# Patient Record
Sex: Female | Born: 1945 | Race: White | Hispanic: No | Marital: Married | Smoking: Never smoker
Health system: Southern US, Community
[De-identification: ages and names within clinical notes are randomized; demographics above are authoritative.]

## PROBLEM LIST (undated history)

## (undated) DIAGNOSIS — I639 Cerebral infarction, unspecified: Secondary | ICD-10-CM

## (undated) DIAGNOSIS — D649 Anemia, unspecified: Secondary | ICD-10-CM

## (undated) DIAGNOSIS — K649 Unspecified hemorrhoids: Secondary | ICD-10-CM

## (undated) DIAGNOSIS — S060X9A Concussion with loss of consciousness of unspecified duration, initial encounter: Secondary | ICD-10-CM

## (undated) DIAGNOSIS — C539 Malignant neoplasm of cervix uteri, unspecified: Secondary | ICD-10-CM

## (undated) DIAGNOSIS — D759 Disease of blood and blood-forming organs, unspecified: Secondary | ICD-10-CM

## (undated) DIAGNOSIS — Z8719 Personal history of other diseases of the digestive system: Secondary | ICD-10-CM

## (undated) DIAGNOSIS — R42 Dizziness and giddiness: Secondary | ICD-10-CM

## (undated) DIAGNOSIS — R51 Headache: Secondary | ICD-10-CM

## (undated) DIAGNOSIS — I82409 Acute embolism and thrombosis of unspecified deep veins of unspecified lower extremity: Secondary | ICD-10-CM

## (undated) DIAGNOSIS — G459 Transient cerebral ischemic attack, unspecified: Secondary | ICD-10-CM

## (undated) DIAGNOSIS — F32A Depression, unspecified: Secondary | ICD-10-CM

## (undated) DIAGNOSIS — K5790 Diverticulosis of intestine, part unspecified, without perforation or abscess without bleeding: Secondary | ICD-10-CM

## (undated) DIAGNOSIS — I10 Essential (primary) hypertension: Secondary | ICD-10-CM

## (undated) DIAGNOSIS — K219 Gastro-esophageal reflux disease without esophagitis: Secondary | ICD-10-CM

## (undated) DIAGNOSIS — F329 Major depressive disorder, single episode, unspecified: Secondary | ICD-10-CM

## (undated) HISTORY — PX: OOPHORECTOMY: SHX86

## (undated) HISTORY — PX: OTHER SURGICAL HISTORY: SHX169

## (undated) HISTORY — DX: Dizziness and giddiness: R42

## (undated) HISTORY — PX: CERVICAL DISC ARTHROPLASTY: SHX587

## (undated) SURGERY — EGD (ESOPHAGOGASTRODUODENOSCOPY)
Anesthesia: Moderate Sedation

---

## 1974-07-17 DIAGNOSIS — C539 Malignant neoplasm of cervix uteri, unspecified: Secondary | ICD-10-CM

## 1974-07-17 HISTORY — DX: Malignant neoplasm of cervix uteri, unspecified: C53.9

## 1992-07-17 HISTORY — PX: OTHER SURGICAL HISTORY: SHX169

## 1992-07-17 HISTORY — PX: ABDOMINAL HYSTERECTOMY: SHX81

## 1999-03-20 ENCOUNTER — Ambulatory Visit (HOSPITAL_COMMUNITY): Admission: RE | Admit: 1999-03-20 | Discharge: 1999-03-20 | Payer: Self-pay | Admitting: Neurosurgery

## 1999-03-20 ENCOUNTER — Encounter: Payer: Self-pay | Admitting: Neurosurgery

## 1999-10-12 ENCOUNTER — Other Ambulatory Visit: Admission: RE | Admit: 1999-10-12 | Discharge: 1999-10-12 | Payer: Self-pay | Admitting: Obstetrics and Gynecology

## 2000-10-15 ENCOUNTER — Other Ambulatory Visit: Admission: RE | Admit: 2000-10-15 | Discharge: 2000-10-15 | Payer: Self-pay | Admitting: Obstetrics and Gynecology

## 2001-11-05 ENCOUNTER — Other Ambulatory Visit: Admission: RE | Admit: 2001-11-05 | Discharge: 2001-11-05 | Payer: Self-pay | Admitting: Obstetrics and Gynecology

## 2002-02-28 ENCOUNTER — Ambulatory Visit (HOSPITAL_COMMUNITY): Admission: RE | Admit: 2002-02-28 | Discharge: 2002-02-28 | Payer: Self-pay | Admitting: Neurology

## 2002-02-28 ENCOUNTER — Encounter: Payer: Self-pay | Admitting: Neurology

## 2002-03-19 ENCOUNTER — Encounter (INDEPENDENT_AMBULATORY_CARE_PROVIDER_SITE_OTHER): Payer: Self-pay | Admitting: Specialist

## 2002-03-19 ENCOUNTER — Ambulatory Visit (HOSPITAL_COMMUNITY): Admission: RE | Admit: 2002-03-19 | Discharge: 2002-03-19 | Payer: Self-pay | Admitting: Gastroenterology

## 2002-03-25 ENCOUNTER — Ambulatory Visit (HOSPITAL_BASED_OUTPATIENT_CLINIC_OR_DEPARTMENT_OTHER): Admission: RE | Admit: 2002-03-25 | Discharge: 2002-03-25 | Payer: Self-pay | Admitting: Orthopedic Surgery

## 2002-07-01 ENCOUNTER — Ambulatory Visit (HOSPITAL_COMMUNITY): Admission: RE | Admit: 2002-07-01 | Discharge: 2002-07-01 | Payer: Self-pay | Admitting: Gastroenterology

## 2002-07-12 ENCOUNTER — Encounter: Payer: Self-pay | Admitting: Internal Medicine

## 2002-07-12 ENCOUNTER — Ambulatory Visit (HOSPITAL_COMMUNITY): Admission: RE | Admit: 2002-07-12 | Discharge: 2002-07-12 | Payer: Self-pay | Admitting: Internal Medicine

## 2002-11-07 ENCOUNTER — Other Ambulatory Visit: Admission: RE | Admit: 2002-11-07 | Discharge: 2002-11-07 | Payer: Self-pay | Admitting: Obstetrics and Gynecology

## 2002-12-03 ENCOUNTER — Encounter: Payer: Self-pay | Admitting: Gastroenterology

## 2002-12-03 ENCOUNTER — Encounter: Admission: RE | Admit: 2002-12-03 | Discharge: 2002-12-03 | Payer: Self-pay | Admitting: Gastroenterology

## 2003-06-18 ENCOUNTER — Ambulatory Visit (HOSPITAL_BASED_OUTPATIENT_CLINIC_OR_DEPARTMENT_OTHER): Admission: RE | Admit: 2003-06-18 | Discharge: 2003-06-18 | Payer: Self-pay | Admitting: Orthopedic Surgery

## 2003-06-18 ENCOUNTER — Ambulatory Visit (HOSPITAL_COMMUNITY): Admission: RE | Admit: 2003-06-18 | Discharge: 2003-06-18 | Payer: Self-pay | Admitting: Orthopedic Surgery

## 2003-08-18 ENCOUNTER — Emergency Department (HOSPITAL_COMMUNITY): Admission: EM | Admit: 2003-08-18 | Discharge: 2003-08-18 | Payer: Self-pay

## 2004-03-28 ENCOUNTER — Ambulatory Visit (HOSPITAL_COMMUNITY): Admission: RE | Admit: 2004-03-28 | Discharge: 2004-03-28 | Payer: Self-pay | Admitting: Internal Medicine

## 2004-05-31 ENCOUNTER — Encounter: Admission: RE | Admit: 2004-05-31 | Discharge: 2004-05-31 | Payer: Self-pay | Admitting: Orthopaedic Surgery

## 2004-07-19 ENCOUNTER — Other Ambulatory Visit: Admission: RE | Admit: 2004-07-19 | Discharge: 2004-07-19 | Payer: Self-pay | Admitting: Obstetrics and Gynecology

## 2004-08-10 ENCOUNTER — Encounter (INDEPENDENT_AMBULATORY_CARE_PROVIDER_SITE_OTHER): Payer: Self-pay | Admitting: Specialist

## 2004-08-10 ENCOUNTER — Ambulatory Visit (HOSPITAL_COMMUNITY): Admission: RE | Admit: 2004-08-10 | Discharge: 2004-08-10 | Payer: Self-pay | Admitting: Gastroenterology

## 2004-08-19 ENCOUNTER — Encounter: Admission: RE | Admit: 2004-08-19 | Discharge: 2004-08-19 | Payer: Self-pay | Admitting: Gastroenterology

## 2004-08-22 ENCOUNTER — Encounter (INDEPENDENT_AMBULATORY_CARE_PROVIDER_SITE_OTHER): Payer: Self-pay | Admitting: *Deleted

## 2004-08-22 ENCOUNTER — Ambulatory Visit (HOSPITAL_COMMUNITY): Admission: RE | Admit: 2004-08-22 | Discharge: 2004-08-22 | Payer: Self-pay | Admitting: Gastroenterology

## 2005-02-20 ENCOUNTER — Encounter: Admission: RE | Admit: 2005-02-20 | Discharge: 2005-02-20 | Payer: Self-pay | Admitting: Gastroenterology

## 2005-04-18 ENCOUNTER — Ambulatory Visit (HOSPITAL_COMMUNITY): Admission: RE | Admit: 2005-04-18 | Discharge: 2005-04-18 | Payer: Self-pay | Admitting: Internal Medicine

## 2005-04-24 ENCOUNTER — Ambulatory Visit (HOSPITAL_COMMUNITY): Admission: RE | Admit: 2005-04-24 | Discharge: 2005-04-24 | Payer: Self-pay | Admitting: Internal Medicine

## 2005-12-21 ENCOUNTER — Encounter: Admission: RE | Admit: 2005-12-21 | Discharge: 2005-12-21 | Payer: Self-pay | Admitting: Internal Medicine

## 2006-04-06 ENCOUNTER — Encounter: Admission: RE | Admit: 2006-04-06 | Discharge: 2006-04-06 | Payer: Self-pay | Admitting: Neurology

## 2006-06-28 ENCOUNTER — Ambulatory Visit (HOSPITAL_COMMUNITY): Admission: RE | Admit: 2006-06-28 | Discharge: 2006-06-28 | Payer: Self-pay | Admitting: Internal Medicine

## 2006-08-27 ENCOUNTER — Ambulatory Visit: Payer: Self-pay | Admitting: Cardiology

## 2006-09-19 ENCOUNTER — Ambulatory Visit: Payer: Self-pay

## 2006-09-19 ENCOUNTER — Ambulatory Visit: Payer: Self-pay | Admitting: Cardiology

## 2006-09-19 ENCOUNTER — Encounter: Payer: Self-pay | Admitting: Cardiology

## 2007-04-09 ENCOUNTER — Ambulatory Visit (HOSPITAL_COMMUNITY)
Admission: RE | Admit: 2007-04-09 | Discharge: 2007-04-09 | Payer: Self-pay | Admitting: Physical Medicine and Rehabilitation

## 2008-02-05 ENCOUNTER — Ambulatory Visit (HOSPITAL_COMMUNITY): Admission: RE | Admit: 2008-02-05 | Discharge: 2008-02-05 | Payer: Self-pay | Admitting: Internal Medicine

## 2008-07-02 ENCOUNTER — Ambulatory Visit (HOSPITAL_COMMUNITY): Admission: RE | Admit: 2008-07-02 | Discharge: 2008-07-05 | Payer: Self-pay | Admitting: Neurosurgery

## 2008-07-17 HISTORY — PX: SPINAL CORD STIMULATOR IMPLANT: SHX2422

## 2009-12-13 ENCOUNTER — Ambulatory Visit: Payer: Self-pay | Admitting: Cardiology

## 2009-12-13 ENCOUNTER — Inpatient Hospital Stay (HOSPITAL_COMMUNITY): Admission: EM | Admit: 2009-12-13 | Discharge: 2009-12-15 | Payer: Self-pay | Admitting: Emergency Medicine

## 2010-08-07 ENCOUNTER — Encounter: Payer: Self-pay | Admitting: Internal Medicine

## 2010-09-09 ENCOUNTER — Encounter (INDEPENDENT_AMBULATORY_CARE_PROVIDER_SITE_OTHER): Payer: Self-pay | Admitting: *Deleted

## 2010-09-09 ENCOUNTER — Encounter: Payer: Self-pay | Admitting: Internal Medicine

## 2010-09-13 NOTE — Letter (Signed)
Summary: New Patient letter  [facility]  [address]   [place], [place] [address]   Phone: [phone]  Fax: [phone]       09/09/2010 MRN: [id number]  [name] FOGLESONG [address] [place], [place]  [address]  Dear Ms. Batch,  Welcome to the Gastroenterology Division at [facility].    You are scheduled to see Dr.  Juanda Chance on 10/17/2010 at 9:15 on the 3rd floor at [facility], [address]  We ask that you try to arrive at our office 15 minutes prior to your appointment time to allow for check-in.  We would like you to complete the enclosed self-administered evaluation form prior to your visit and bring it with you on the day of your appointment.  We will review it with you.  Also, please bring a complete list of all your medications or, if you prefer, bring the medication bottles and we will list them.  Please bring your insurance card so that we may make a copy of it.  If your insurance requires a referral to see a specialist, please bring your referral form from your primary care physician.  Co-payments are due at the time of your visit and may be paid by cash, check or credit card.     Your office visit will consist of a consult with your physician (includes a physical exam), any laboratory testing he/she may order, scheduling of any necessary diagnostic testing (e.g. x-ray, ultrasound, CT-scan), and scheduling of a procedure (e.g. Endoscopy, Colonoscopy) if required.  Please allow enough time on your schedule to allow for any/all of these possibilities.    If you cannot keep your appointment, please call [phone] to cancel or reschedule prior to your appointment date.  This allows [place] the opportunity to schedule an appointment for another patient in need of care.  If you do not cancel or reschedule by 5 p.m. the business day prior to your appointment date, you will be charged a $50.00 late cancellation/no-show fee.    Thank you for choosing [facility] for your medical needs.  We appreciate the opportunity to care for you.  Please visit [place] at our website  to learn more about our practice.                     Sincerely,                                                             The Gastroenterology Division

## 2010-09-13 NOTE — Miscellaneous (Signed)
Summary: DR Loreta Ave PT  We recieved a call from Dr Jeri Lager requesting office visit with Dr Juanda Chance for patient. They told our office that patient was seen by Dr Loreta Ave last in 2006. Per Dr Kenna Gilbert office, patient was last seen with them on May 24, 2010. [name] Brooks, [facility] will call back to cancel appointment since office policy states that if patient has been seen within 1 year by another GI physician, patient must have all previous GI records reviewed by our doctor before accepting as a patient. Dottie Nelson-Smith CMA Duncan Dull)  September 09, 2010 1:48 PM

## 2010-10-03 LAB — DIFFERENTIAL
Lymphocytes Relative: 25 % (ref 12–46)
Lymphs Abs: 3 10*3/uL (ref 0.7–4.0)
Monocytes Absolute: 0.8 10*3/uL (ref 0.1–1.0)
Monocytes Relative: 7 % (ref 3–12)
Neutro Abs: 8.3 10*3/uL — ABNORMAL HIGH (ref 1.7–7.7)

## 2010-10-03 LAB — CBC
HCT: 40.2 % (ref 36.0–46.0)
HCT: 40.8 % (ref 36.0–46.0)
Hemoglobin: 11.3 g/dL — ABNORMAL LOW (ref 12.0–15.0)
Hemoglobin: 13.3 g/dL (ref 12.0–15.0)
MCHC: 32.6 g/dL (ref 30.0–36.0)
MCV: 82.5 fL (ref 78.0–100.0)
Platelets: 282 10*3/uL (ref 150–400)
RDW: 14.2 % (ref 11.5–15.5)
RDW: 14.3 % (ref 11.5–15.5)
RDW: 14.4 % (ref 11.5–15.5)

## 2010-10-03 LAB — URINALYSIS, ROUTINE W REFLEX MICROSCOPIC
Bilirubin Urine: NEGATIVE
Ketones, ur: NEGATIVE mg/dL
Nitrite: NEGATIVE
Protein, ur: NEGATIVE mg/dL
pH: 6 (ref 5.0–8.0)

## 2010-10-03 LAB — BASIC METABOLIC PANEL
BUN: 3 mg/dL — ABNORMAL LOW (ref 6–23)
BUN: 7 mg/dL (ref 6–23)
CO2: 27 mEq/L (ref 19–32)
Calcium: 8.8 mg/dL (ref 8.4–10.5)
Creatinine, Ser: 0.91 mg/dL (ref 0.4–1.2)
GFR calc non Af Amer: >60 mL/min (ref >60)
GFR calc non Af Amer: 56 mL/min — ABNORMAL LOW (ref >60)
GFR calc non Af Amer: >60 mL/min (ref >60)
Glucose, Bld: 97 mg/dL (ref 70–99)
Glucose, Bld: 105 mg/dL — ABNORMAL HIGH (ref 70–99)
Glucose, Bld: 88 mg/dL (ref 70–99)
Potassium: 3.3 mEq/L — ABNORMAL LOW (ref 3.5–5.1)
Potassium: 3.4 mEq/L — ABNORMAL LOW (ref 3.5–5.1)
Sodium: 141 mEq/L (ref 135–145)
Sodium: 142 mEq/L (ref 135–145)

## 2010-10-03 LAB — URINE CULTURE
Colony Count: NO GROWTH
Culture: NO GROWTH

## 2010-10-03 LAB — URINE MICROSCOPIC-ADD ON

## 2010-10-03 LAB — COMPREHENSIVE METABOLIC PANEL
Albumin: 3.8 g/dL (ref 3.5–5.2)
BUN: 13 mg/dL (ref 6–23)
Creatinine, Ser: 1.01 mg/dL (ref 0.4–1.2)
Total Protein: 7.6 g/dL (ref 6.0–8.3)

## 2010-10-03 LAB — HEMOCCULT GUIAC POC 1CARD (OFFICE): Fecal Occult Bld: NEGATIVE

## 2010-10-03 LAB — CARDIAC PANEL(CRET KIN+CKTOT+MB+TROPI)
CK, MB: 2.5 ng/mL (ref 0.3–4.0)
CK, MB: 3 ng/mL (ref 0.3–4.0)
Relative Index: 2.3 (ref 0.0–2.5)
Total CK: 130 U/L (ref 7–177)
Total CK: 133 U/L (ref 7–177)
Troponin I: 0.01 ng/mL (ref 0.00–0.06)

## 2010-10-17 ENCOUNTER — Ambulatory Visit: Payer: Self-pay | Admitting: Internal Medicine

## 2010-11-29 NOTE — Op Note (Signed)
NAMEWINDI, TORO NO.:  [id number]   MEDICAL RECORD NO.:  [id number]          PATIENT TYPE:  OIB   LOCATION:  3526                         FACILITY:  [facility]   PHYSICIAN:  Danae Orleans. Venetia Maxon, M.D.  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   PROCEDURE:  Thoracic laminectomy and placement of spinal cord  stimulator, tripolar lead with implantable pulse generator in  subcutaneous pocket.   SURGEON:  Danae Orleans. Venetia Maxon, MD   ASSISTANT:  Georgiann Cocker, RN   ANESTHESIA:  General endotracheal anesthesia.   ESTIMATED BLOOD LOSS:  Minimal.   COMPLICATIONS:  None.   DISPOSITION:  Recovery.   INDICATIONS:  Legacy Carrender is a 65 year old woman with chronic intractable  bilateral lower extremity and low back pain.  She had done well with a  trial of spinal cord stimulation and was elected to place a spinal cord  stimulator.   PROCEDURE IN DETAIL:  Ms. Detwiler was brought to the operating room.  Following a satisfactory and an uncomplicated induction of general  endotracheal anesthesia plus placement of intravenous lines, the patient  was placed in a prone position on chest rolls.  Her back and upper  buttocks were then prepped and draped in the usual sterile fashion with  Betadine scrub and paint.  C-arm fluoroscopy was used to confirm the  level.  An incision was made in the midline after infiltrating the skin  and subcutaneous tissues with local lidocaine at the T10-T11 level,  carried through adipose tissue to the thoracolumbar fascia and was  incised sharply on the left side of midline.  After confirmation of  level, a hemilaminotomy of T10 was performed and spinal cord dura was  exposed at this time with a  high-speed drill and variety Kerrison  rongeurs.  Hemostasis was assured with bone wax.  After trial dummy  paddle placement, the tripolar lead was selected and it was positioned  with the superior aspect of the bottom of T7 covering all of T8 and T9  to the midline and extending to the right of midline as the patient has  more right than left-sided pain.  The fascia was then closed, Silastic  anchors were anchored to the supraspinous ligament, and a loop of  electrode was then placed in the subcutaneous pocket.  A subcutaneous  tunnel was then created after incising on the left side between the  iliac crest and ribs.  A subcutaneous pocket was created.  The  retractable battery was then placed and connections were locked down  appropriately and torqued appropriately.  The redundant electrode was  placed circularized behind the battery and the assembly was placed  within the subcutaneous pocket.  Wounds were  irrigated and closed with 2-0 and 3-0 Vicryl sutures and wounds were  dressed with Dermabond.  Impedances were checked and found  to be  correct.  The patient was then extubated in the operating room and taken  to recovery in stable and satisfactory condition having tolerated the  operation well.  All counts were correct at the end of the case.      Danae Orleans. Venetia Maxon, M.D.  Electronically Signed     JDS/MEDQ  D:  07/02/2008  T:  07/02/2008  Job:  [id number]

## 2010-12-02 NOTE — Assessment & Plan Note (Signed)
[facility]                            CARDIOLOGY OFFICE NOTE   NAME:Booz, TAMBERLY POMPLUN                         MRN:          [id number]  DATE:08/27/2006                            DOB:          [birth year]    I was asked by Dr. Sudie Bailey to do a preoperative clearance for shoulder  arthroscopy on Ms. Marquesha Gassner.   She carries a diagnosis of nonspecific tachycardia.   HISTORY OF PRESENT ILLNESS:  Ms. Blough is a 65 year old, married white  female who comes with her husband today with a history of nonspecific  tachycardia.   She occasionally feels her heart beating fast when she is getting  dressed for church and doing other activities. She sometimes gets a  little hot and sweaty but denies any chest pressure, pain or radiation  to her arm.   She is really not having a lot of tachypalpitations per say.   Her electrocardiogram in the office have shown a sinus tachycardia or  sinus rhythm. I do not see any other arrhythmias. Dr. Sudie Bailey  recommended a Holter monitor but she would prefer [place] do it up here.   PAST MEDICAL HISTORY:  She has unspecified depression and today has  multiple complaints. She has a chronic tremor followed by Dr. Sandria Manly. She  has also got a lower back problem that Dr. Venetia Maxon is following. She has  essential hypertension but recently because of orthostatic changes is  off all antihypertensives. She has chronic vertigo that Dr. Sandria Manly is  following as well.   Apparently she had a blood clot in her right leg in the past.   She also has hyperlipidemia well controlled on Vytorin and  hypothyroidism with recent normal thyroid studies August 14, 2006.   She is intolerant of prednisone. She has no dye allergy.   CURRENT MEDICATIONS:  1. Plavix 75 mg a day.  2. Topamax 25 mg a day.  3. Vytorin 10/40.  4. Cymbalta 30 mg a day.  5. Levothyroxine 25 mg a day.   PAST SURGICAL HISTORY:  Neck surgery in 2003.   FAMILY HISTORY:  Father and  mother both died of heart attacks. Mother  was 61, father 33. There is no premature history of coronary disease in  her family.   SOCIAL HISTORY:  She sits at home and looks at the walls all day. She  is very depressed. She is married and has 2 children.   REVIEW OF SYSTEMS:  She has had chronic fatigue, apparently has had an  ulcer in the past, hiatal hernia, history of gastroesophageal reflux,  chronic arthritis, anxiety and depression. The rest of her review of  systems other than the HPI are negative.   PHYSICAL EXAMINATION:  GENERAL:  She has a very flat affect and very  short answers.  VITAL SIGNS:  Her blood pressure is 120/78, her pulse is 89 and regular.  She is in normal sinus rhythm with a normal PR, QRS and QT interval. Her  weight is 170, she is 5 feet 3.  HEENT:  Normocephalic, atraumatic. PERRLA. Extraocular movements intact.  Sclera clear. Facial symmetry is normal. Dentition is satisfactory.  NECK:  Carotid upstrokes are equal bilaterally without bruits. There is  no JVD. Thyroid is not enlarged, trachea is midline.  LUNGS:  Clear to auscultation and percussion.  HEART:  Reveals a normal S1, S2 without murmur or gallop.  ABDOMEN:  Soft with good bowel sounds, no midline bruit. There is no  hepatomegalia.  EXTREMITIES:  Reveal no edema. Pulses are intact.  NEUROLOGIC:  Grossly intact.  SKIN:  Intact.   EKG again shows sinus rhythm with some low voltage and normal PR, QRS,  and QTc interval.   ASSESSMENT:  1. Sensations of tachycardia with a diagnosis of a nonspecified      tachycardia. Her thyroid replacement is euthyroid and she is on no      drugs that I think would exacerbate her heart rate.  2. Before we clear her for surgery, we need to make sure that her left      ventricular function is normal and she has no other structural      heart disease. We also will check a 40-hour Holter to rule out any      associated arrhythmias.     Thomas C. Daleen Squibb, MD,  [facility]  Electronically Signed    TCW/MedQ  DD: 08/27/2006  DT: 08/27/2006  Job #: [id number]   cc:   Philemon Kingdom

## 2010-12-02 NOTE — Op Note (Signed)
   NAME:  [name] Brooks, [name] Brooks                      ACCOUNT NO.:  [id number]   MEDICAL RECORD NO.:  [id number]                   PATIENT TYPE:  AMB   LOCATION:  ENDO                                 FACILITY:  [facility]   PHYSICIAN:  Anselmo Rod, M.D.               DATE OF BIRTH:  04-27-1946   DATE OF PROCEDURE:  07/01/2002  DATE OF DISCHARGE:                                 OPERATIVE REPORT   PROCEDURE PERFORMED:  Colonoscopy.   ENDOSCOPIST:  Charna Elizabeth, M.D.   INSTRUMENT USED:  Olympus adjustable pediatric video colonoscope.   INDICATIONS FOR PROCEDURE:  Personal history of colonic polyps in a 65-year-  old white female.  Rule out recurrent polyps.   PREPROCEDURE PREPARATION:  Informed consent was procured from the patient.  The patient was fasted for eight hours prior to the procedure and prepped  with a bottle of  MiraLax the night prior to the procedure.  The patient was  also advised to discontinue her Plavix four days prior to the procedure.  This was verified with Dr. Imagene Gurney office.   DESCRIPTION OF PROCEDURE:  The patient was placed in left lateral decubitus  position and sedated with 100 mg of Demerol and 10 mg of Versed  intravenously.  Once the patient was adequately sedated and maintained on  low flow oxygen and continuous cardiac monitoring, the Olympus video  colonoscope was advanced from the rectum to the cecum without difficulty.  There was a large amount of residual stool in the colon.  No masses, polyps,  erosions, ulcerations or diverticula were seen.  The terminal ileum appeared  normal.  Small lesions could have been missed.   IMPRESSION:  1. Large amount of residual stool in the colon.  No masses or polyps seen.  2. Small lesions could have been missed.  3. Multiple washes done to adequate visualize the colonic mucosa.  Patient's     position changed from left lateral to supine position.  Procedure     complete up to the cecum.   RECOMMENDATIONS:  1.  Repeat colorectal cancer screening is recommended in the next five years     unless the patient develops any     abnormal symptoms in the interim.  2. Resume Plavix.  3. Outpatient follow-up on a p.r.n. basis.                                                   Anselmo Rod, M.D.    JNM/MEDQ  D:  07/01/2002  T:  07/01/2002  Job:  [id number]   cc:   Marcelino Duster L. Vincente Poli, M.D.  [address], [address] [place]  [place] [address]  Fax: [phone]

## 2010-12-02 NOTE — Op Note (Signed)
NAME:  [name] Brooks, [name] Brooks                      ACCOUNT NO.:  [id number]   MEDICAL RECORD NO.:  [id number]                   PATIENT TYPE:  AMB   LOCATION:  ENDO                                 FACILITY:  [facility]   PHYSICIAN:  Charna Elizabeth, M.D.                   DATE OF BIRTH:  December 18, 1945   DATE OF PROCEDURE:  03/19/2002  DATE OF DISCHARGE:                                 OPERATIVE REPORT   PROCEDURE PERFORMED:  Esophagogastroduodenoscopy with biopsy.   ENDOSCOPIST:  Charna Elizabeth, M.D.   INSTRUMENT USED:  Olympus video panendoscope.   INDICATIONS FOR PROCEDURE:  The patient is a 65 year old white female with  dysphagia, retrosternal discomfort, coughing and regurgitation.  Rule out  esophagitis, ulcer disease, etc.  The patient has a history of severe  reflux.   PREPROCEDURE PREPARATION:  Informed consent was procured from the patient.  The patient was fasted for eight hours prior to the procedure.   PREPROCEDURE PHYSICAL:  The patient had stable vital signs.  Neck supple,  chest clear to auscultation.  S1, S2 regular.  Abdomen soft with normal  bowel sounds.   DESCRIPTION OF PROCEDURE:  The patient was placed in the left lateral  decubitus position and sedated with 60 mg of Demerol and 6 mg of Versed  intravenously.  Once the patient was adequately sedated and maintained on  low-flow oxygen and continuous cardiac monitoring, the Olympus video  panendoscope was advanced through the mouth piece over the tongue into the  esophagus under direct vision.  The entire esophagus appeared widely patent  with no evidence of ring, stricture, masses, esophagus or Barrett's mucosa.  The scope was then advanced to the stomach.  There was a patchy area of  hemorrhagic gastritis in the antrum.  Biopsies were done to rule out  presence of Helicobacter pylori.  The rest of the gastric mucosa appeared  normal.  A small  hiatal hernia was seen on retroflexion.  The proximal  small bowel including  the duodenal bulb appeared normal as well up to 60 cm.   IMPRESSION:  1. Antral gastritis.  2. Small  hiatal hernia.  3. Normal-appearing esophagus and proximal small bowel.   RECOMMENDATIONS:  1. Continue present medication including proton pump inhibitor.  2.     Treat with antibiotics if Helicobacter pylori present.  3. Avoid all nonsteroidals including aspirin.  4. Outpatient follow-up in two weeks.                                               Charna Elizabeth, M.D.    JM/MEDQ  D:  03/20/2002  T:  03/20/2002  Job:  [id number]   cc:   Veverly Fells. Arletha Grippe, M.D.   Michelle L. Vincente Poli, M.D.  [address], [address]  [place]  [place] [address]  Fax: [phone]

## 2010-12-02 NOTE — Op Note (Signed)
[name], [name] Brooks                  ACCOUNT NO.:  [id number]   MEDICAL RECORD NO.:  [id number]          PATIENT TYPE:  AMB   LOCATION:  ENDO                         FACILITY:  [facility]   PHYSICIAN:  Anselmo Rod, M.D.  DATE OF BIRTH:  [birth year]   DATE OF PROCEDURE:  08/22/2004  DATE OF DISCHARGE:                                 OPERATIVE REPORT   PROCEDURE:  Colonoscopy with random biopsies.   ENDOSCOPIST:  Anselmo Rod, M.D.   INSTRUMENT USED:  Olympus video colonoscope.   INDICATIONS FOR PROCEDURE:  A 66 year old white female underwent a screening  colonoscopy.  The patient has a history of episodic diarrhea with severe  abdominal pain, rule out collagenous colitis, masses, polyps, etc.   PRE-PROCEDURE PREPARATION:  An informed consent was procured from the  patient.  The patient was fasted for eight hours prior to the procedure, and  prepped with a bottle of magnesium citrate and one gallon of GoLYTELY on the  night prior to the procedure.  The risks and benefits of the procedure,  including a 10% missed rate of cancer and polyps, was discussed with the  patient as well.   PRE-PROCEDURE PHYSICAL EXAMINATION:  VITAL SIGNS:  Stable vital signs.  NECK:  Supple.  CHEST:  Clear to auscultation.  HEART:  S1, S2 regular.  ABDOMEN:  Soft, with normal bowel sounds.   DESCRIPTION OF PROCEDURE:  The patient was placed in the left lateral  decubitus position and sedated with Demerol and Versed for the EGD.  No  additional sedation was used for the colonoscopy.  Once the patient was  adequately sedated and maintained on low-flow oxygen and continuous cardiac  monitoring, the Olympus video colonoscope was advanced from the rectum to  the cecum.  The appendicular orifice and ileocecal valve were clearly  visualized and photographed.  Random colon biopsies were done to rule out  collagenous versus microscopic colitis.  The terminal ileum appeared healthy  and without lesions.  A  retroflexed view revealed no abnormalities.   The patient tolerated the procedure well, without immediate complications.   IMPRESSION:  Normal colonoscopy of the terminal ileum.  No masses, polyps,  erosions, or ulcerations were noted.  A few scattered early diverticula were  seen in the sigmoid colon.   RECOMMENDATIONS:  1.  Continue a high-fiber diet with liberal fluid intake.  2.  Await biopsy results.  3.  Outpatient followup in the next two weeks.  4.  Repeat colonoscopy is recommended in the next five years, unless the      patient develops any abnormal symptoms in the interim.      JNM/MEDQ  D:  08/22/2004  T:  08/22/2004  Job:  [id number]   cc:   Philemon Kingdom, M.D.  [place], [place]

## 2010-12-02 NOTE — Op Note (Signed)
NAMESCOTLYNN, NOYES                  ACCOUNT NO.:  [id number]   MEDICAL RECORD NO.:  [id number]          PATIENT TYPE:  AMB   LOCATION:  ENDO                         FACILITY:  [facility]   PHYSICIAN:  Anselmo Rod, M.D.  DATE OF BIRTH:  24-Apr-1946   DATE OF PROCEDURE:  08/22/2004  DATE OF DISCHARGE:                                 OPERATIVE REPORT   PROCEDURE PERFORMED:  Esophagogastroduodenoscopy with small bowel biopsies.   ENDOSCOPIST:  Charna Elizabeth, M.D.   INSTRUMENT USED:  Olympus video panendoscope.   INDICATIONS FOR PROCEDURE:  The patient is a 65 year old white female with a  history of abdominal pain and change in bowel habits with worsening  diarrhea, episodically rule out sprue.   PREPROCEDURE PREPARATION:  Informed consent was procured from the patient.  The patient was fasted for eight hours prior to the procedure.  The risks  and benefits of the procedure were discussed with the patient in great  detail.   PREPROCEDURE PHYSICAL:  The patient had stable vital signs.  Neck supple,  chest clear to auscultation.  S1, S2 regular.  Abdomen soft with normal  bowel sounds.   DESCRIPTION OF PROCEDURE:  The patient was placed in the left lateral  decubitus position and sedated with 100 mg of Demerol and 10 mg of Versed in  slow incremental doses.  Once the patient was adequately sedated and  maintained on low-flow oxygen and continuous cardiac monitoring, the Olympus  video panendoscope was advanced through the mouth piece over the tongue into  the esophagus under direct vision.  The entire esophagus appeared normal  with no evidence of ring, stricture, masses, esophagitis or Barrett's  mucosa.  The scope was then advanced to the stomach.  The entire gastric  mucosa appeared normal.  A small hiatal hernia was seen on high  retroflexion.  The proximal small bowel appeared normal.  Small bowel  biopsies were down to rule out sprue.  There was no outlet obstruction, no  ulcers,  erosions, masses or polyps identified.   IMPRESSION:  1.  Essentially normal esophagogastroduodenoscopy except for a small hiatal      hernia.  2.  Small bowel biopsies done to rule out sprue.   RECOMMENDATIONS:  1.  Await pathology results.  2.  Proceed with colonoscopy at this time.  Further recommendations made      after colonoscopy has been done.      JNM/MEDQ  D:  08/22/2004  T:  08/22/2004  Job:  [id number]   cc:   Philemon Kingdom  [address]  [place]  [place] [address]  Fax: [phone]

## 2010-12-02 NOTE — Op Note (Signed)
[name], [name]                      ACCOUNT NO.:  [id number]   MEDICAL RECORD NO.:  [id number]                   PATIENT TYPE:  AMB   LOCATION:  ENDO                                 FACILITY:  [facility]   PHYSICIAN:  Katy Fitch. Naaman Plummer., M.D.          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   OPERATION:  1. Release of left transverse carpal ligament.  2. Release of left first dorsal compartment through separate surgeon.   SURGEON:  Katy Fitch. Sypher, M.D.   ASSISTANT:  Jonni Sanger, P.A.   ANESTHESIA:  General by LMA.   SUPERVISING ANESTHESIOLOGIST:  Janetta Hora. Gelene Mink, M.D.   INDICATIONS FOR PROCEDURE:  The patient is a 65 year old woman with a  history of bilateral hand numbness and pain at the first dorsal compartment  on the left. She has failed nonoperative measures. She is brought to the  operating room at this time anticipating release of her left transverse  carpal ligament and release of her first dorsal compartment.   DESCRIPTION OF PROCEDURE:  The patient is brought to the operating room and  placed in supine position upon the operating table. Following induction of  general anesthesia, the left arm was prepped with Betadine solution and  sterilely draped. Following exsanguination of the limb with Esmarch bandage  and arterial tourniquet on the  proximal brachial was inflated to 120 mmHg.  The procedure commenced with a short incision in the line of the ring finger  of the palm. The subcutaneous tissue was carefully divided revealing the  palmar fascia. This was  split longitudinally to reveal the incompetence  transference of the median nerve. The median nerve proper was identified and  carefully isolated from the transverse carpal ligament. The link was then  released from its ulnar border of _________ extending into the distal  forearm. This widely opened the carpal canal. No masses or other  predicaments were noted. Bleeding points along the margin of the loose  ligament were electrocauterized by bipolar current. The skin was repaired  with interrupted 3-0  Prolene suture. Attention was then directed to the  first dorsal compartment. The palpably enlarged compartment was exposed  through a transverse incision directly over the visible and palpable bump.  The subcutaneous tissue was carefully divided taking care to gently retract  the radial sensory branches. The compartment had a pair of giant cell  collections noted. The compartment was incised longitudinally with a  scalpel. The wall thickness was approximately 3 mm. Two abductor pollicis  longus tendon slips and small extensor pollicis brevis slip were noted.  There was  not a septum nor a dorsal separate compartment for the extensor  pollicis brevis. The tendons were delivered and found to be invested with  inflamed tenosynovium. No other predicaments were noted. The tendons were  placed in the compartment and the wound repaired with intradermal #0 Prolene  suture. [name] was placed in a compressive dressing with a volar plaster splint and  tendons and fingers in dorsiflexion. She is discharged with a prescription  of Percocet 5 mg 1-2 tablets p.o. q. 4-6 p.r.n. pain, 20 tablets without  refill. She will return to our office for follow-up in 7-10 days for a  dressing change and advancement to an exercise program.                                               Katy Fitch. Naaman Plummer., M.D.    RVS/MEDQ  D:  03/25/2002  T:  03/26/2002  Job:  [id number]

## 2010-12-02 NOTE — Op Note (Signed)
NAME:  [name], Brooks                      ACCOUNT NO.:  [id number]   MEDICAL RECORD NO.:  [id number]                   PATIENT TYPE:  AMB   LOCATION:  DSC                                  FACILITY:  [facility]   PHYSICIAN:  Katy Fitch. Naaman Plummer., M.D.          DATE OF BIRTH:  1946/04/03   DATE OF PROCEDURE:  06/18/2003  DATE OF DISCHARGE:                                 OPERATIVE REPORT   PREOPERATIVE DIAGNOSIS:  Entrapment neuropathy median nerve, right carpal  tunnel.   POSTOPERATIVE DIAGNOSIS:  Entrapment neuropathy median nerve, right carpal  tunnel.   OPERATION:  Release of right transverse carpal ligament.   SURGEON:  Katy Fitch. Sypher, M.D.   ASSISTANT:  Jonni Sanger, P.A.   ANESTHESIA:  General by LMA.   ANESTHESIOLOGIST:  Maren Beach, M.D.   INDICATIONS FOR PROCEDURE:  [name] Brooks is a 65 year old woman referred for  evaluation and management of a numb right hand.  Clinical examination  revealed signs of significant entrapment neuropathy of the median nerve at  the level of the carpal ligament.  Electrodiagnostic studies completed at  [facility] confirmed significant median neuropathy.  Due to a  failure to respond to nonoperative measures, she is brought to the operating  room at this time for release of her right transverse carpal ligament.   PROCEDURE:  [name] Brooks is brought to the operating room and placed on supine  position on the operating table.  Following induction of general anesthesia  by LMA, the right arm was prepped with Betadine solution and sterilely  draped.  Following exsanguination of the limb with an Esmarch bandage, an  arterial tourniquet was inflated to 220 mmHg.  The procedure commenced with  a short incision in the line of the ring finger in the palm.  The  subcutaneous tissues were carefully divided revealing the palmar fascia.  This was split longitudinally to reveal the common sensory branches of the  median nerve.  These  were followed back to the transverse carpal ligament  which was carefully isolated from the ligament.  The ligament was then  released with scissors along the ulnar border extending into the distal  forearm.  This widely opened the carpal canal.  No masses were noted.  Bleeding points along the margins of the released ligament were  electrocauterized with bipolar current followed by repair of the skin with  intradermal 3-0 Prolene suture.  A compressive dressing was applied with a  volar plaster splint with the wrist in 5 degrees dorsiflexion.   For aftercare, [name] Brooks was given a prescription for Percocet 5 mg, 1-2  tablets p.o. q.4-6h. p.r.n. pain, 20 tablets without refill.  She will  return to the office for follow up in one week or sooner p.r.n. problems.  :  Katy Fitch Naaman Plummer., M.D.    RVS/MEDQ  D:  06/18/2003  T:  06/18/2003  Job:  [id number]

## 2011-01-19 ENCOUNTER — Other Ambulatory Visit (HOSPITAL_COMMUNITY): Payer: Self-pay | Admitting: Internal Medicine

## 2011-01-19 DIAGNOSIS — R55 Syncope and collapse: Secondary | ICD-10-CM

## 2011-01-20 ENCOUNTER — Other Ambulatory Visit (HOSPITAL_COMMUNITY): Payer: Self-pay

## 2011-01-23 ENCOUNTER — Inpatient Hospital Stay (HOSPITAL_COMMUNITY): Admission: RE | Admit: 2011-01-23 | Payer: Self-pay | Source: Ambulatory Visit

## 2011-01-24 ENCOUNTER — Other Ambulatory Visit (HOSPITAL_COMMUNITY): Payer: Self-pay | Admitting: Internal Medicine

## 2011-01-24 DIAGNOSIS — R55 Syncope and collapse: Secondary | ICD-10-CM

## 2011-01-26 ENCOUNTER — Other Ambulatory Visit (HOSPITAL_COMMUNITY): Payer: Self-pay

## 2011-04-20 LAB — BASIC METABOLIC PANEL
BUN: 12 mg/dL (ref 6–23)
CO2: 24 mEq/L (ref 19–32)
Calcium: 9.8 mg/dL (ref 8.4–10.5)
Creatinine, Ser: 1.17 mg/dL (ref 0.4–1.2)
Glucose, Bld: 128 mg/dL — ABNORMAL HIGH (ref 70–99)

## 2011-04-20 LAB — CBC
MCHC: 32.6 g/dL (ref 30.0–36.0)
RDW: 14.7 % (ref 11.5–15.5)

## 2012-02-23 ENCOUNTER — Other Ambulatory Visit: Payer: Self-pay | Admitting: Neurosurgery

## 2012-02-23 DIAGNOSIS — M549 Dorsalgia, unspecified: Secondary | ICD-10-CM

## 2012-03-13 ENCOUNTER — Other Ambulatory Visit: Payer: Self-pay

## 2012-03-13 ENCOUNTER — Inpatient Hospital Stay: Admission: RE | Admit: 2012-03-13 | Payer: Self-pay | Source: Ambulatory Visit

## 2012-03-19 ENCOUNTER — Ambulatory Visit
Admission: RE | Admit: 2012-03-19 | Discharge: 2012-03-19 | Disposition: A | Discharge status: Home / Self Care | Payer: Medicare Other | Source: Ambulatory Visit | Attending: Neurosurgery | Admitting: Neurosurgery

## 2012-03-19 ENCOUNTER — Inpatient Hospital Stay
Admission: RE | Admit: 2012-03-19 | Discharge: 2012-03-19 | Disposition: A | Discharge status: Home / Self Care | Payer: Medicare Other | Source: Ambulatory Visit | Attending: Neurosurgery | Admitting: Neurosurgery

## 2012-03-19 VITALS — BP 147/72 | HR 81

## 2012-03-19 DIAGNOSIS — M549 Dorsalgia, unspecified: Secondary | ICD-10-CM

## 2012-03-19 MED ORDER — DIAZEPAM 5 MG PO TABS
5.0000 mg | ORAL_TABLET | Freq: Once | ORAL | Status: AC
  Administered 2012-03-19: 5 mg via ORAL

## 2012-03-19 MED ORDER — HYDROMORPHONE HCL PF 2 MG/ML IJ SOLN
2.0000 mg | Freq: Once | INTRAMUSCULAR | Status: AC
  Administered 2012-03-19: 2 mg via INTRAMUSCULAR

## 2012-03-19 MED ORDER — IOHEXOL 300 MG/ML  SOLN
10.0000 mL | Freq: Once | INTRAMUSCULAR | Status: AC | PRN
  Administered 2012-03-19: 10 mL via INTRATHECAL

## 2012-03-19 MED ORDER — ONDANSETRON HCL 4 MG/2ML IJ SOLN
4.0000 mg | Freq: Once | INTRAMUSCULAR | Status: AC
  Administered 2012-03-19: 4 mg via INTRAMUSCULAR

## 2012-03-19 NOTE — Progress Notes (Signed)
Patient states she's been off Plavix for more than five days.  She states she has been off Tramadol, Sertraline and Promethazine for at least two days.  jkl

## 2012-05-09 ENCOUNTER — Other Ambulatory Visit: Payer: Self-pay | Admitting: Gastroenterology

## 2012-07-01 ENCOUNTER — Encounter (HOSPITAL_COMMUNITY)
Admission: RE | Disposition: A | Discharge status: Home / Self Care | Payer: Self-pay | Source: Ambulatory Visit | Attending: Gastroenterology

## 2012-07-01 ENCOUNTER — Ambulatory Visit (HOSPITAL_COMMUNITY)
Admission: RE | Admit: 2012-07-01 | Discharge: 2012-07-01 | Disposition: A | Discharge status: Home / Self Care | Payer: Medicare Other | Source: Ambulatory Visit | Attending: Gastroenterology | Admitting: Gastroenterology

## 2012-07-01 ENCOUNTER — Encounter (HOSPITAL_COMMUNITY): Payer: Self-pay | Admitting: *Deleted

## 2012-07-01 DIAGNOSIS — D509 Iron deficiency anemia, unspecified: Secondary | ICD-10-CM | POA: Insufficient documentation

## 2012-07-01 DIAGNOSIS — D126 Benign neoplasm of colon, unspecified: Secondary | ICD-10-CM | POA: Insufficient documentation

## 2012-07-01 DIAGNOSIS — K449 Diaphragmatic hernia without obstruction or gangrene: Secondary | ICD-10-CM | POA: Insufficient documentation

## 2012-07-01 DIAGNOSIS — R1310 Dysphagia, unspecified: Secondary | ICD-10-CM | POA: Insufficient documentation

## 2012-07-01 DIAGNOSIS — K2990 Gastroduodenitis, unspecified, without bleeding: Secondary | ICD-10-CM | POA: Insufficient documentation

## 2012-07-01 DIAGNOSIS — K2970 Gastritis, unspecified, without bleeding: Secondary | ICD-10-CM | POA: Insufficient documentation

## 2012-07-01 DIAGNOSIS — Z79899 Other long term (current) drug therapy: Secondary | ICD-10-CM | POA: Insufficient documentation

## 2012-07-01 DIAGNOSIS — K5730 Diverticulosis of large intestine without perforation or abscess without bleeding: Secondary | ICD-10-CM | POA: Insufficient documentation

## 2012-07-01 HISTORY — DX: Unspecified hemorrhoids: K64.9

## 2012-07-01 HISTORY — DX: Anemia, unspecified: D64.9

## 2012-07-01 HISTORY — DX: Gastro-esophageal reflux disease without esophagitis: K21.9

## 2012-07-01 HISTORY — DX: Personal history of other diseases of the digestive system: Z87.19

## 2012-07-01 HISTORY — PX: ESOPHAGOGASTRODUODENOSCOPY: SHX5428

## 2012-07-01 HISTORY — DX: Diverticulosis of intestine, part unspecified, without perforation or abscess without bleeding: K57.90

## 2012-07-01 HISTORY — DX: Headache: R51

## 2012-07-01 HISTORY — PX: COLONOSCOPY: SHX5424

## 2012-07-01 HISTORY — DX: Major depressive disorder, single episode, unspecified: F32.9

## 2012-07-01 HISTORY — DX: Malignant neoplasm of cervix uteri, unspecified: C53.9

## 2012-07-01 HISTORY — DX: Depression, unspecified: F32.A

## 2012-07-01 SURGERY — EGD (ESOPHAGOGASTRODUODENOSCOPY)
Anesthesia: Moderate Sedation

## 2012-07-01 MED ORDER — MIDAZOLAM HCL 10 MG/2ML IJ SOLN
INTRAMUSCULAR | Status: DC | PRN
  Administered 2012-07-01 (×5): 2 mg via INTRAVENOUS

## 2012-07-01 MED ORDER — MIDAZOLAM HCL 10 MG/2ML IJ SOLN
INTRAMUSCULAR | Status: AC
  Filled 2012-07-01: qty 2

## 2012-07-01 MED ORDER — FENTANYL CITRATE 0.05 MG/ML IJ SOLN
INTRAMUSCULAR | Status: AC
  Filled 2012-07-01: qty 2

## 2012-07-01 MED ORDER — SODIUM CHLORIDE 0.9 % IV SOLN
INTRAVENOUS | Status: DC
  Administered 2012-07-01: 500 mL via INTRAVENOUS

## 2012-07-01 MED ORDER — LIDOCAINE VISCOUS 2 % MT SOLN
OROMUCOSAL | Status: AC
  Filled 2012-07-01: qty 15

## 2012-07-01 MED ORDER — FENTANYL CITRATE 0.05 MG/ML IJ SOLN
INTRAMUSCULAR | Status: DC | PRN
  Administered 2012-07-01 (×4): 25 ug via INTRAVENOUS

## 2012-07-01 MED ORDER — LIDOCAINE VISCOUS 2 % MT SOLN
OROMUCOSAL | Status: DC | PRN
  Administered 2012-07-01: 20 mL via OROMUCOSAL

## 2012-07-01 NOTE — Op Note (Signed)
[facility] [address] [place], [address]   OPERATIVE PROCEDURE REPORT  PATIENT :[name] Brooks, [name] Brooks  MR#: [id number] BIRTHDATE :1946-06-30 GENDER: Female ENDOSCOPIST: Dr.  Lorenza Burton, MD ASSISTANT:   Beryle Beams, technician & Anthony Sar, RN PROCEDURE DATE: 07/28/12  PRE-PROCEDURE PREPERATION: Patient fasted for 4 hours prior to procedure. PRE-PROCEDURE PHYSICAL: Patient has stable vital signs.  Neck is supple.  There is no JVD, thyromegaly or LAD.  Chest clear to auscultation.  S1 and S2 regular.  Abdomen soft, non-distended, non-tender with NABS. PROCEDURE:     EGD diagnostic ASA CLASS:     Class III INDICATIONS:     1) Iron deficiency anemia 2) Dysphagia 3) Rectal bleeding . MEDICATIONS:     Fentanyl 75 mcg and Versed 6 mg IV TOPICAL ANESTHETIC:   Viscous xylocaine-15 cc PO.  DESCRIPTION OF PROCEDURE: After the risks benefits and alternatives of the procedure were thoroughly explained, informed consent was obtained.  The Pentax Gastroscope [id number]  was introduced through the mouth and advanced to the second portion of the duodenum , without limitations. The instrument was slowly withdrawn as the mucosa was fully examined.   The esophagus, GEJ and the proximal small bowel appeared normal. There were no ulcers, erosions, masses or polyps noted. Retroflexed views revealed a 3-4 cm hiatal hernia. Patchy erythema was noted in the antrum /gastritis. The scope was then withdrawn from the patient and the procedure terminated. The patient tolerated the procedure without immediate complications.  IMPRESSION:  Mild antral gastritis and 3-4 cm hiatal hernia; otherwise, normal esophagogastroduodenoscopy.  RECOMMENDATIONS:     1.  Anti-reflux regimen to be followed. 2.  Avoid NSAIDS for two weeks. 3.  Proceed with a colonoscopy.   REPEAT EXAM:  None planned for now. Marland Kitchen DISCHARGE INSTRUCTIONS: Standard discharge instructions  given.  _______________________________ eSigned:  Dr. Lorenza Burton, MD 07-28-2012 5:29 PM   CPT CODES:     95621  DIAGNOSIS CODES:     280.9, 787.20, 569.3   CC: Philemon Kingdom, M.D.  PATIENT NAME:  [name] Brooks, [name] Brooks MR#: [id number]

## 2012-07-01 NOTE — H&P (Signed)
[name] Brooks is an 66 y.o. female.   Chief Complaint:  Iron deficiency anemia. HPI: Patient is here for an EGD/Colonoscopy for further evaluation of IDA. She has had some periumbilical pain but denies any other GI symptoms at this time.   Past Medical History  Diagnosis Date  . Headache   . H/O hiatal hernia   . GERD (gastroesophageal reflux disease)   . Anemia   . Depression   . Cervical cancer   . Hemorrhoids   . Difficult intubation   . Diverticulosis    Past Surgical History  Procedure Date  . Bilateral shoulder   . Bladder tack   . Abdominal hysterectomy   . Cervical disc arthroplasty   . Oophorectomy    History reviewed. No pertinent family history. Social History:  reports that she has never smoked. She does not have any smokeless tobacco history on file. She reports that she does not drink alcohol or use illicit drugs.  Allergies:  Allergies  Allergen Reactions  . Sulfa Antibiotics Hives and Rash    Blisters in groin/perineal area; swelling/rash of face/mouth/tongue.  . Prednisone Other (See Comments)    "Drives me crazy."   Medications Prior to Admission  Medication Sig Dispense Refill  . clopidogrel (PLAVIX) 75 MG tablet Take 75 mg by mouth daily.      Marland Kitchen dicyclomine (BENTYL) 10 MG capsule Take 10 mg by mouth 4 (four) times daily -  before meals and at bedtime.      . sertraline (ZOLOFT) 100 MG tablet Take 100 mg by mouth daily.      . simvastatin (ZOCOR) 40 MG tablet Take 40 mg by mouth every evening.      . topiramate (TOPAMAX) 25 MG capsule Take 25 mg by mouth Nightly.      . traMADol (ULTRAM) 50 MG tablet Take 50 mg by mouth every 6 (six) hours as needed.      . triamterene-hydrochlorothiazide (MAXZIDE-25) 37.5-25 MG per tablet Take 1 tablet by mouth daily.       No results found for this or any previous visit (from the past 48 hour(s)). No results found.  Review of Systems  Constitutional: Negative.   HENT: Negative.   Eyes: Negative.   Respiratory:  Negative.   Cardiovascular: Negative.   Gastrointestinal: Positive for abdominal pain. Negative for heartburn, nausea, vomiting, diarrhea, constipation, blood in stool and melena.  Genitourinary: Negative.   Skin: Negative.   Neurological: Negative.   Endo/Heme/Allergies: Negative.   Psychiatric/Behavioral: Positive for depression. Negative for suicidal ideas, hallucinations, memory loss and substance abuse. The patient is nervous/anxious. The patient does not have insomnia.    Blood pressure 136/75, pulse 86, temperature 97.2 F (36.2 C), temperature source Oral, resp. rate 16, height 5\' 2"  (1.575 m), weight 74.844 kg (165 lb), SpO2 96.00%. Physical Exam  Constitutional: She is oriented to person, place, and time. She appears well-developed and well-nourished.  HENT:  Head: Normocephalic and atraumatic.  Eyes: Conjunctivae normal and EOM are normal. Pupils are equal, round, and reactive to light.  Neck: Normal range of motion. Neck supple.  Cardiovascular: Normal rate and regular rhythm.   Respiratory: Effort normal and breath sounds normal.  GI: Soft. Bowel sounds are normal.  Musculoskeletal: Normal range of motion.  Neurological: She is alert and oriented to person, place, and time.  Skin: Skin is warm and dry.  Psychiatric: She has a normal mood and affect. Her behavior is normal. Judgment and thought content normal.  Assessment/Plan Iron deficiency anemia: proceed with an EGD/Colonoscopy at this time.  Cobi Aldape 07/01/2012, 4:14 PM

## 2012-07-01 NOTE — Op Note (Signed)
[facility] [address] [place], [address]   OPERATIVE PROCEDURE REPORT  PATIENT: [name], [name]  MR#: [id number] BIRTHDATE: 08-Jul-1946 GENDER: Female ENDOSCOPIST: Dr.  Lorenza Burton, MD ASSISTANT:   Beryle Beams, technician & Anthony Sar, RN PROCEDURE DATE: 07/01/2012  PRE-PROCEDURE PREPARATION: Moviprep was taken as instructed (32 ounces the night prior to the procedure and 32 ounces the morning of the procedure).  The patient was fasted for four hours prior to the procedure. She held her Aspirin and Plavix for 5 days prior to the procedure. PRE-PROCEDURE PHYSICAL: Patient has stable vital signs.  Neck is supple.  There is no JVD, thyromegaly or LAD.  Chest clear to auscultation.  S1 and S2 regular.  Abdomen soft, non-distended, non-tender with NABS. PROCEDURE:     Colonoscopy with hot snare polypectomy x 1. ASA CLASS:     Class III INDICATIONS:     1.  Rectal bleeding  2.  Colorectal cancer screening. MEDICATIONS:     Fentanyl 25 mcg  and Versed 4 mg IV.  DESCRIPTION OF PROCEDURE: After the risks, benefits, and alternatives of the procedure were thoroughly explained [including a 10% missed rate of cancer and polyps], informed consent was obtained.  Digital rectal exam was performed.  The Pentax Colonoscope [id number]  was introduced through the anus  and advanced to the cecum, which was identified by both the appendix and ileocecal valve , limited by No adverse events experienced. The quality of the prep was fair. . Multiple washes were done. Small lesions could be missed. The instrument was then slowly withdrawn as the colon was fully examined.     COLON FINDINGS: A sessile polyp measuring 8-81mm in size was found in the descending colon and was removed by a hot snare x 1 [150/15]. Scattered diverticula were also noted especially in the sigmoid colon. The rest of the colonic mucosa appeared healthy with a normal vascular pattern.  No masses or AVMs  were noted.  The appendiceal orifice and the ICV were identified and photographed. Retroflexed views revealed no abnormalities.  The patient tolerated the procedure without immediate complications.  The scope was then withdrawn from the patient and the procedure terminated.  TIME TO CECUM:   12 minutes 00 seconds WITHDRAW TIME:  06 minutes 00 seconds  IMPRESSION:     1) Sessile polyp 8-61mm in size in the descending colon removed by hot snare x 1. 2) Few scattered diverticula.     RECOMMENDATIONS:     1.  Await pathology results. 2.  Avoid all NSAIDS for the next 2 weeks. 3.  High fiber diet with liberal fluid intake. 4.  OP follow-up is advised on a PRN basis. 5) Resume Aspirin and Plavix in 7 days.   REPEAT EXAM:      In 5 years  for a repeat colonoscopy.  If the patient has any abnormal GI symptoms in the interim, she has been advised to contact the office as soon as possible for further recommendations.   CPT CODES:     [id number], Colonoscopy with Polypectomy (Snare)   DIAGNOSIS CODES:     211.3, 569.3, 562.10, V76.51  REFERRED NF:[id number] Prochnau, M.D.  eSigned:  Dr. Lorenza Burton, MD 07/01/2012 5:39 PM   PATIENT NAME:  [name] Brooks, [name] Brooks MR#: [id number]

## 2012-07-02 ENCOUNTER — Encounter (HOSPITAL_COMMUNITY): Payer: Self-pay | Admitting: Gastroenterology

## 2013-06-03 ENCOUNTER — Ambulatory Visit: Payer: Medicare Other | Admitting: Cardiology

## 2013-08-12 ENCOUNTER — Other Ambulatory Visit: Payer: Self-pay | Admitting: Neurosurgery

## 2013-08-12 DIAGNOSIS — M47812 Spondylosis without myelopathy or radiculopathy, cervical region: Secondary | ICD-10-CM

## 2013-08-14 ENCOUNTER — Ambulatory Visit
Admission: RE | Admit: 2013-08-14 | Discharge: 2013-08-14 | Disposition: A | Discharge status: Home / Self Care | Payer: Medicare Other | Source: Ambulatory Visit | Attending: Neurosurgery | Admitting: Neurosurgery

## 2013-08-14 DIAGNOSIS — M47812 Spondylosis without myelopathy or radiculopathy, cervical region: Secondary | ICD-10-CM

## 2013-11-24 ENCOUNTER — Ambulatory Visit: Payer: Medicare Other | Admitting: Neurology

## 2014-06-03 ENCOUNTER — Encounter: Payer: Self-pay | Admitting: Neurology

## 2014-06-09 ENCOUNTER — Encounter: Payer: Self-pay | Admitting: Neurology

## 2014-06-17 ENCOUNTER — Other Ambulatory Visit: Payer: Self-pay | Admitting: Gastroenterology

## 2014-06-17 DIAGNOSIS — R1032 Left lower quadrant pain: Secondary | ICD-10-CM

## 2014-06-22 ENCOUNTER — Other Ambulatory Visit: Payer: Medicare Other

## 2014-07-01 ENCOUNTER — Ambulatory Visit
Admission: RE | Admit: 2014-07-01 | Discharge: 2014-07-01 | Disposition: A | Discharge status: Home / Self Care | Payer: Medicare Other | Source: Ambulatory Visit | Attending: Gastroenterology | Admitting: Gastroenterology

## 2014-07-01 DIAGNOSIS — R1032 Left lower quadrant pain: Secondary | ICD-10-CM

## 2014-07-01 MED ORDER — IOHEXOL 300 MG/ML  SOLN
100.0000 mL | Freq: Once | INTRAMUSCULAR | Status: AC | PRN
  Administered 2014-07-01: 100 mL via INTRAVENOUS

## 2014-09-11 DIAGNOSIS — E785 Hyperlipidemia, unspecified: Secondary | ICD-10-CM | POA: Diagnosis not present

## 2014-09-11 DIAGNOSIS — I10 Essential (primary) hypertension: Secondary | ICD-10-CM | POA: Diagnosis not present

## 2014-09-11 DIAGNOSIS — Z23 Encounter for immunization: Secondary | ICD-10-CM | POA: Diagnosis not present

## 2014-09-11 DIAGNOSIS — E039 Hypothyroidism, unspecified: Secondary | ICD-10-CM | POA: Diagnosis not present

## 2014-09-11 DIAGNOSIS — E611 Iron deficiency: Secondary | ICD-10-CM | POA: Diagnosis not present

## 2014-10-08 DIAGNOSIS — M7732 Calcaneal spur, left foot: Secondary | ICD-10-CM | POA: Diagnosis not present

## 2014-10-08 DIAGNOSIS — M7752 Other enthesopathy of left foot: Secondary | ICD-10-CM | POA: Diagnosis not present

## 2014-10-17 DIAGNOSIS — R112 Nausea with vomiting, unspecified: Secondary | ICD-10-CM | POA: Diagnosis not present

## 2014-10-17 DIAGNOSIS — R251 Tremor, unspecified: Secondary | ICD-10-CM | POA: Diagnosis not present

## 2014-10-17 DIAGNOSIS — Z8673 Personal history of transient ischemic attack (TIA), and cerebral infarction without residual deficits: Secondary | ICD-10-CM | POA: Diagnosis not present

## 2014-10-17 DIAGNOSIS — R110 Nausea: Secondary | ICD-10-CM | POA: Diagnosis not present

## 2014-10-17 DIAGNOSIS — R55 Syncope and collapse: Secondary | ICD-10-CM | POA: Diagnosis not present

## 2014-10-17 DIAGNOSIS — R42 Dizziness and giddiness: Secondary | ICD-10-CM | POA: Diagnosis not present

## 2014-10-17 DIAGNOSIS — I10 Essential (primary) hypertension: Secondary | ICD-10-CM | POA: Diagnosis not present

## 2014-10-17 DIAGNOSIS — K2970 Gastritis, unspecified, without bleeding: Secondary | ICD-10-CM | POA: Diagnosis not present

## 2014-10-19 DIAGNOSIS — I10 Essential (primary) hypertension: Secondary | ICD-10-CM | POA: Diagnosis not present

## 2014-10-19 DIAGNOSIS — Z6831 Body mass index (BMI) 31.0-31.9, adult: Secondary | ICD-10-CM | POA: Diagnosis not present

## 2014-10-27 DIAGNOSIS — Z6832 Body mass index (BMI) 32.0-32.9, adult: Secondary | ICD-10-CM | POA: Diagnosis not present

## 2014-10-27 DIAGNOSIS — I10 Essential (primary) hypertension: Secondary | ICD-10-CM | POA: Diagnosis not present

## 2014-10-27 DIAGNOSIS — L989 Disorder of the skin and subcutaneous tissue, unspecified: Secondary | ICD-10-CM | POA: Diagnosis not present

## 2014-11-02 DIAGNOSIS — R42 Dizziness and giddiness: Secondary | ICD-10-CM | POA: Diagnosis not present

## 2014-11-02 DIAGNOSIS — S93422A Sprain of deltoid ligament of left ankle, initial encounter: Secondary | ICD-10-CM | POA: Diagnosis not present

## 2014-11-03 DIAGNOSIS — R2689 Other abnormalities of gait and mobility: Secondary | ICD-10-CM | POA: Diagnosis not present

## 2014-11-03 DIAGNOSIS — R51 Headache: Secondary | ICD-10-CM | POA: Diagnosis not present

## 2014-11-03 DIAGNOSIS — R42 Dizziness and giddiness: Secondary | ICD-10-CM | POA: Diagnosis not present

## 2014-11-03 DIAGNOSIS — Z6831 Body mass index (BMI) 31.0-31.9, adult: Secondary | ICD-10-CM | POA: Diagnosis not present

## 2014-11-03 DIAGNOSIS — I10 Essential (primary) hypertension: Secondary | ICD-10-CM | POA: Diagnosis not present

## 2014-11-05 ENCOUNTER — Encounter: Payer: Self-pay | Admitting: Neurology

## 2014-11-05 ENCOUNTER — Ambulatory Visit (INDEPENDENT_AMBULATORY_CARE_PROVIDER_SITE_OTHER): Payer: Medicare Other | Admitting: Neurology

## 2014-11-05 VITALS — BP 167/84 | HR 73 | Ht 62.0 in | Wt 172.0 lb

## 2014-11-05 DIAGNOSIS — R2689 Other abnormalities of gait and mobility: Secondary | ICD-10-CM

## 2014-11-05 DIAGNOSIS — R51 Headache: Secondary | ICD-10-CM | POA: Diagnosis not present

## 2014-11-05 DIAGNOSIS — R42 Dizziness and giddiness: Secondary | ICD-10-CM

## 2014-11-05 DIAGNOSIS — R29818 Other symptoms and signs involving the nervous system: Secondary | ICD-10-CM

## 2014-11-05 DIAGNOSIS — R519 Headache, unspecified: Secondary | ICD-10-CM

## 2014-11-05 NOTE — Progress Notes (Signed)
PATIENT: [name] Brooks DOB: 1946-05-08  HISTORICAL  [name] Brooks is a 69 years old female, accompanied by her husband, referred by her primary care physician Dr. Delena Bali for evaluation of recurrent episode of dizziness, unsteady gait  He had a history of chronic low back pain, with spinal cord stimulator placement in 2010 by Dr. Vertell Limber, which has helped her low back, bilateral lower extremity pain  In April first 2016, while lying down in her bed taking a nap, she felt sudden onset of vertigo, unsteady gait, nausea, vomiting, presented to [facility], reported elevated blood pressure 190/110, she had a CAT scan of the brain, reported no acute abnormality, but I was not able to review the CD that she brought today  Since the symptom onset, she has unsteady gait, nystagmus, in November 04 2014, evening time, she had a sudden onset severe transient explosive headaches, worsening gait difficulty, unsteadiness,difficulty focusing She is taking Plavix daily  REVIEW OF SYSTEMS: Full 14 system review of systems performed and notable only for blurry vision, snoring, spinning sensation aching muscles feeding cold, confusion and dizziness, tremor depression, insomnia snoring and restless leg  ALLERGIES: Allergies  Allergen Reactions  . Sulfa Antibiotics Hives and Rash    Blisters in groin/perineal area; swelling/rash of face/mouth/tongue.  . Prednisone Other (See Comments)    "Drives me crazy."    HOME MEDICATIONS: Current Outpatient Prescriptions  Medication Sig Dispense Refill  . atorvastatin (LIPITOR) 40 MG tablet Take 40 mg by mouth daily.    . clopidogrel (PLAVIX) 75 MG tablet Take 75 mg by mouth daily.  5  . dicyclomine (BENTYL) 10 MG capsule Take 10 mg by mouth 4 (four) times daily -  before meals and at bedtime. As needed.    Marland Kitchen levothyroxine (SYNTHROID, LEVOTHROID) 50 MCG tablet Take 50 mcg by mouth daily before breakfast.    . metoprolol (LOPRESSOR) 50 MG tablet 2 (two) times  daily.    Marland Kitchen topiramate (TOPAMAX) 25 MG capsule Take 25 mg by mouth Nightly.    . traMADol (ULTRAM) 50 MG tablet Take 50 mg by mouth every 6 (six) hours as needed.    . venlafaxine (EFFEXOR) 75 MG tablet Take 75 mg by mouth daily.      PAST MEDICAL HISTORY: Past Medical History  Diagnosis Date  . Headache(784.0)   . H/O hiatal hernia   . GERD (gastroesophageal reflux disease)   . Anemia   . Depression   . Cervical cancer   . Hemorrhoids   . Difficult intubation   . Diverticulosis   . Dizziness     PAST SURGICAL HISTORY: Past Surgical History  Procedure Laterality Date  . Bilateral shoulder    . Bladder tack    . Abdominal hysterectomy    . Cervical disc arthroplasty    . Oophorectomy    . Esophagogastroduodenoscopy  07/01/2012    Procedure: ESOPHAGOGASTRODUODENOSCOPY (EGD);  Surgeon: Juanita Craver, MD;  Location: [facility] ENDOSCOPY;  Service: Endoscopy;  Laterality: N/A;  . Colonoscopy  07/01/2012    Procedure: COLONOSCOPY;  Surgeon: Juanita Craver, MD;  Location: [facility] ENDOSCOPY;  Service: Endoscopy;  Laterality: N/A;    FAMILY HISTORY: Family History  Problem Relation Age of Onset  . Aneurysm Mother   . Heart disease Mother   . Arthritis Father     SOCIAL HISTORY:  History   Social History  . Marital Status: Married    Spouse Name: N/A  . Number of Children: 2  . Years of Education:  12   Occupational History  . Retired     Social History Main Topics  . Smoking status: Never Smoker   . Smokeless tobacco: Not on file  . Alcohol Use: No  . Drug Use: No  . Sexual Activity: Not on file   Other Topics Concern  . Not on file   Social History Narrative   Lives at home with husband.   Right-handed.   No caffeine use.   PHYSICAL EXAM   Filed Vitals:   11/05/14 1452  BP: 167/84  Pulse: 73  Height: _0  (1.575 m)  Weight: 172 lb (78.019 kg)    Not recorded      Body mass index is 31.45 kg/(m^2).  PHYSICAL EXAMNIATION:  Gen: NAD, conversant, well  nourised, obese, well groomed                     Cardiovascular: Regular rate rhythm, no peripheral edema, warm, nontender. Eyes: Conjunctivae clear without exudates or hemorrhage Neck: Supple, no carotid bruise. Pulmonary: Clear to auscultation bilaterally   NEUROLOGICAL EXAM:  MENTAL STATUS: Speech:    Speech is normal; fluent and spontaneous with normal comprehension.  Cognition:    The patient is oriented to person, place, and time;     recent and remote memory intact;     language fluent;     normal attention, concentration,     fund of knowledge.  CRANIAL NERVES: CN II: Visual fields are full to confrontation. Fundoscopic exam is normal with sharp discs and no vascular changes. Venous pulsations are present bilaterally. Pupils are 4 mm and briskly reactive to light. Visual acuity is 20/20 bilaterally. CN III, IV, VI: extraocular movement are normal. No ptosis. CN V: Facial sensation is intact to pinprick in all 3 divisions bilaterally. Corneal responses are intact.  CN VII: Face is symmetric with normal eye closure and smile. CN VIII: Hearing is normal to rubbing fingers CN IX, X: Palate elevates symmetrically. Phonation is normal. CN XI: Head turning and shoulder shrug are intact CN XII: Tongue is midline with normal movements and no atrophy.  MOTOR: There is no pronator drift of out-stretched arms. Muscle bulk and tone are normal. Muscle strength is normal.   Shoulder abduction Shoulder external rotation Elbow flexion Elbow extension Wrist flexion Wrist extension Finger abduction Hip flexion Knee flexion Knee extension Ankle dorsi flexion Ankle plantar flexion  R _1 L _2 REFLEXES: Reflexes are 2+ and symmetric at the biceps, triceps, knees, and ankles. Plantar responses are flexor.  SENSORY: Light touch, pinprick, position sense, and vibration sense are intact in fingers and toes.  COORDINATION: She has moderate trunk  ataxia, mild to moderate finger to nose, heel-to-shin dysmetria GAIT/STANCE: She has trunk ataxia, wide-based, cautious, unsteady gait Romberg is absent.   DIAGNOSTIC DATA (LABS, IMAGING, TESTING) - I reviewed patient records, labs, notes, testing and imaging myself where available.  Lab Results  Component Value Date   WBC 5.7 12/14/2009   HGB 11.3 REPEATED TO VERIFY* 12/14/2009   HCT 33.7* 12/14/2009   MCV 82.4 12/14/2009   PLT 246 12/14/2009      Component Value Date/Time   NA 141 12/15/2009 0456   K 3.3* 12/15/2009 0456   CL 109 12/15/2009 0456   CO2 24 12/15/2009 0456   GLUCOSE 97 12/15/2009 0456   BUN 3*  12/15/2009 0456   CREATININE 0.91 12/15/2009 0456   CALCIUM 8.6 12/15/2009 0456   PROT 7.6 12/12/2009 2037   ALBUMIN 3.8 12/12/2009 2037   AST 36 12/12/2009 2037   ALT 42* 12/12/2009 2037   ALKPHOS 115 12/12/2009 2037   BILITOT 0.7 12/12/2009 2037   GFRNONAA >60 12/15/2009 0456   GFRAA  12/15/2009 0456    >60        The eGFR has been calculated using the MDRD equation. This calculation has not been validated in all clinical situations. eGFR's persistently <60 mL/min signify possible Chronic Kidney Disease.   No results found for: CHOL, HDL, LDLCALC, LDLDIRECT, TRIG, CHOLHDL No results found for: HGBA1C No results found for: [place] is a 69 y.o. female  With past medical history of hyperlipidemia, chronic low back pain, status post stimulator placement, presenting with acute onset of dizziness, vertigo, nausea, unsteady gait, on examinations, she has trunk more than limb ataxia, consistent with cerebellum/brain stem stroke  1, keep Plavix daily 2. Complete evaluation with CT of head, CT angiogram of brain and neck 3, echocardiogram 4. Moderate exercise 5. Return to clinic in 2 weeks   Marcial Pacas, M.D. Ph.D.  [facility] [address] [place], [place] [address] Ph: [phone] Fax:  [phone]

## 2014-11-06 ENCOUNTER — Telehealth: Payer: Self-pay | Admitting: Neurology

## 2014-11-06 LAB — VITAMIN B12: Vitamin B-12: 483 pg/mL (ref 211–946)

## 2014-11-06 LAB — CBC WITH DIFFERENTIAL/PLATELET
BASOS ABS: 0 10*3/uL (ref 0.0–0.2)
Basos: 0 %
Eos: 2 %
Eosinophils Absolute: 0.2 10*3/uL (ref 0.0–0.4)
HCT: 41.5 % (ref 34.0–46.6)
Hemoglobin: 13.3 g/dL (ref 11.1–15.9)
IMMATURE GRANS (ABS): 0 10*3/uL (ref 0.0–0.1)
Immature Granulocytes: 0 %
LYMPHS: 32 %
Lymphocytes Absolute: 2.8 10*3/uL (ref 0.7–3.1)
MCH: 25.1 pg — AB (ref 26.6–33.0)
MCHC: 32 g/dL (ref 31.5–35.7)
MCV: 78 fL — AB (ref 79–97)
MONOS ABS: 0.6 10*3/uL (ref 0.1–0.9)
Monocytes: 7 %
NEUTROS PCT: 59 %
Neutrophils Absolute: 5.1 10*3/uL (ref 1.4–7.0)
PLATELETS: 282 10*3/uL (ref 150–379)
RBC: 5.29 x10E6/uL — ABNORMAL HIGH (ref 3.77–5.28)
RDW: 15.5 % — AB (ref 12.3–15.4)
WBC: 8.7 10*3/uL (ref 3.4–10.8)

## 2014-11-06 LAB — TSH: TSH: 3.3 u[IU]/mL (ref 0.450–4.500)

## 2014-11-06 LAB — COMPREHENSIVE METABOLIC PANEL
A/G RATIO: 1.8 (ref 1.1–2.5)
ALT: 17 IU/L (ref 0–32)
AST: 23 IU/L (ref 0–40)
Albumin: 4.5 g/dL (ref 3.6–4.8)
Alkaline Phosphatase: 125 IU/L — ABNORMAL HIGH (ref 39–117)
BUN / CREAT RATIO: 16 (ref 11–26)
BUN: 17 mg/dL (ref 8–27)
Bilirubin Total: 0.4 mg/dL (ref 0.0–1.2)
CALCIUM: 9.3 mg/dL (ref 8.7–10.3)
CO2: 28 mmol/L (ref 18–29)
CREATININE: 1.08 mg/dL — AB (ref 0.57–1.00)
Chloride: 96 mmol/L — ABNORMAL LOW (ref 97–108)
GFR calc non Af Amer: 53 mL/min/{1.73_m2} — ABNORMAL LOW (ref >59)
GFR, EST AFRICAN AMERICAN: 61 mL/min/{1.73_m2} (ref >59)
Globulin, Total: 2.5 g/dL (ref 1.5–4.5)
Glucose: 91 mg/dL (ref 65–99)
POTASSIUM: 3.6 mmol/L (ref 3.5–5.2)
Sodium: 140 mmol/L (ref 134–144)
Total Protein: 7 g/dL (ref 6.0–8.5)

## 2014-11-06 LAB — RPR: RPR: NONREACTIVE

## 2014-11-06 NOTE — Telephone Encounter (Signed)
Patient aware of results and keep well hydrated.

## 2014-11-06 NOTE — Telephone Encounter (Signed)
Please call patient, laboratory showed mild elevated creatinine, GFR was 53, mildly decreased, also mild elevated alkaline phosphate,  Please advise patient keep well hydration before and after CT angiograms

## 2014-11-10 ENCOUNTER — Telehealth: Payer: Self-pay | Admitting: Neurology

## 2014-11-10 ENCOUNTER — Ambulatory Visit (HOSPITAL_COMMUNITY): Payer: Medicare Other | Attending: Neurology | Admitting: Cardiology

## 2014-11-10 DIAGNOSIS — R29818 Other symptoms and signs involving the nervous system: Secondary | ICD-10-CM | POA: Diagnosis not present

## 2014-11-10 DIAGNOSIS — R2689 Other abnormalities of gait and mobility: Secondary | ICD-10-CM

## 2014-11-10 DIAGNOSIS — R51 Headache: Secondary | ICD-10-CM | POA: Diagnosis present

## 2014-11-10 DIAGNOSIS — R42 Dizziness and giddiness: Secondary | ICD-10-CM | POA: Insufficient documentation

## 2014-11-10 DIAGNOSIS — Z79899 Other long term (current) drug therapy: Secondary | ICD-10-CM | POA: Diagnosis not present

## 2014-11-10 DIAGNOSIS — R519 Headache, unspecified: Secondary | ICD-10-CM

## 2014-11-10 NOTE — Telephone Encounter (Signed)
[name] Brooks aware of results.

## 2014-11-10 NOTE — Telephone Encounter (Signed)
Please call patient, echocardiogram showed normal left ventricular function

## 2014-11-10 NOTE — Progress Notes (Signed)
Echo performed. 

## 2014-11-12 ENCOUNTER — Other Ambulatory Visit: Payer: Self-pay

## 2014-11-12 ENCOUNTER — Ambulatory Visit
Admission: RE | Admit: 2014-11-12 | Discharge: 2014-11-12 | Disposition: A | Discharge status: Home / Self Care | Payer: Medicare Other | Source: Ambulatory Visit | Attending: Neurology | Admitting: Neurology

## 2014-11-12 DIAGNOSIS — R51 Headache: Secondary | ICD-10-CM

## 2014-11-12 DIAGNOSIS — R42 Dizziness and giddiness: Secondary | ICD-10-CM | POA: Diagnosis not present

## 2014-11-12 DIAGNOSIS — R2689 Other abnormalities of gait and mobility: Secondary | ICD-10-CM

## 2014-11-12 DIAGNOSIS — H5500 Unspecified nystagmus: Secondary | ICD-10-CM | POA: Diagnosis not present

## 2014-11-12 DIAGNOSIS — R519 Headache, unspecified: Secondary | ICD-10-CM

## 2014-11-12 MED ORDER — IOPAMIDOL (ISOVUE-370) INJECTION 76%
75.0000 mL | Freq: Once | INTRAVENOUS | Status: AC | PRN
  Administered 2014-11-12: 75 mL via INTRAVENOUS

## 2014-11-13 ENCOUNTER — Other Ambulatory Visit: Payer: Self-pay

## 2014-11-16 ENCOUNTER — Inpatient Hospital Stay: Admission: RE | Admit: 2014-11-16 | Payer: Self-pay | Source: Ambulatory Visit

## 2014-11-16 ENCOUNTER — Other Ambulatory Visit: Payer: Self-pay

## 2014-11-16 ENCOUNTER — Other Ambulatory Visit (HOSPITAL_COMMUNITY): Payer: Self-pay

## 2014-11-19 ENCOUNTER — Encounter: Payer: Self-pay | Admitting: Neurology

## 2014-11-19 ENCOUNTER — Ambulatory Visit (INDEPENDENT_AMBULATORY_CARE_PROVIDER_SITE_OTHER): Payer: Medicare Other | Admitting: Neurology

## 2014-11-19 ENCOUNTER — Ambulatory Visit: Payer: Self-pay | Admitting: Neurology

## 2014-11-19 VITALS — BP 151/79 | HR 70 | Ht 62.0 in | Wt 175.0 lb

## 2014-11-19 DIAGNOSIS — R519 Headache, unspecified: Secondary | ICD-10-CM

## 2014-11-19 DIAGNOSIS — H519 Unspecified disorder of binocular movement: Secondary | ICD-10-CM

## 2014-11-19 DIAGNOSIS — R51 Headache: Secondary | ICD-10-CM | POA: Diagnosis not present

## 2014-11-19 DIAGNOSIS — R29818 Other symptoms and signs involving the nervous system: Secondary | ICD-10-CM | POA: Diagnosis not present

## 2014-11-19 DIAGNOSIS — R2689 Other abnormalities of gait and mobility: Secondary | ICD-10-CM

## 2014-11-19 DIAGNOSIS — R42 Dizziness and giddiness: Secondary | ICD-10-CM | POA: Diagnosis not present

## 2014-11-19 NOTE — Progress Notes (Signed)
PATIENT: [name] Brooks DOB: Nov 27, 1945  HISTORICAL  [name] Brooks is a 69 years old female, accompanied by her husband, referred by her primary care physician Dr. Delena Bali for evaluation of recurrent episode of dizziness, unsteady gait  She had a history of chronic low back pain, with spinal cord stimulator placement in 2010 by Dr. Vertell Limber, which has helped her low back, bilateral lower extremity pain  In April first 2016, while lying down in her bed taking a nap, she felt sudden onset of vertigo, unsteady gait, nausea, vomiting, presented to [facility], reported elevated blood pressure 190/110, she had a CAT scan of the brain, reported no acute abnormality, but I was not able to review the CD that she brought today  Since the symptom onset, she has unsteady gait, nystagmus, in November 04 2014, evening time, she had a sudden onset severe transient explosive headaches, worsening gait difficulty, unsteadiness,difficulty focusing She is taking Plavix daily  UPDATE May 5th 2016: She still loose balance, mild headaches, compared to initial symptoms in April 1st 2016, she has mild improvement,   She still has tremor, difficulty focusing, she is scared of driving now, felt that her steering wheel is loose  I have personally reviewed CT scans, CT head showed moderate atrophy, no acute stroke, subcortical white matter changes, enlarged arachnoid space at posterior fossa, CTA of neck and head showed no large vessel disease. Echocardiogram was essentially normal  She continue complains of frequent eye jerking movement, difficulty focusing, losing balance easily,  REVIEW OF SYSTEMS: Full 14 system review of systems performed and notable only for tremor, agitation, confusion, decreased concentration, depression, anxiety, chills, excessive sweating, hearing loss, cough, daytime sleepiness, snoring  ALLERGIES: Allergies  Allergen Reactions  . Sulfa Antibiotics Hives and Rash    Blisters in  groin/perineal area; swelling/rash of face/mouth/tongue.  . Prednisone Other (See Comments)    "Drives me crazy."    HOME MEDICATIONS: Current Outpatient Prescriptions  Medication Sig Dispense Refill  . atorvastatin (LIPITOR) 40 MG tablet Take 40 mg by mouth daily.    . clopidogrel (PLAVIX) 75 MG tablet Take 75 mg by mouth daily.  5  . dicyclomine (BENTYL) 10 MG capsule Take 10 mg by mouth 4 (four) times daily -  before meals and at bedtime. As needed.    Marland Kitchen levothyroxine (SYNTHROID, LEVOTHROID) 50 MCG tablet Take 50 mcg by mouth daily before breakfast.    . metoprolol (LOPRESSOR) 50 MG tablet 2 (two) times daily.    Marland Kitchen topiramate (TOPAMAX) 25 MG capsule Take 25 mg by mouth Nightly.    . traMADol (ULTRAM) 50 MG tablet Take 50 mg by mouth every 6 (six) hours as needed.    . venlafaxine (EFFEXOR) 75 MG tablet Take 75 mg by mouth daily.      PAST MEDICAL HISTORY: Past Medical History  Diagnosis Date  . Headache(784.0)   . H/O hiatal hernia   . GERD (gastroesophageal reflux disease)   . Anemia   . Depression   . Cervical cancer   . Hemorrhoids   . Difficult intubation   . Diverticulosis   . Dizziness     PAST SURGICAL HISTORY: Past Surgical History  Procedure Laterality Date  . Bilateral shoulder    . Bladder tack    . Abdominal hysterectomy    . Cervical disc arthroplasty    . Oophorectomy    . Esophagogastroduodenoscopy  07/01/2012    Procedure: ESOPHAGOGASTRODUODENOSCOPY (EGD);  Surgeon: Juanita Craver, MD;  Location: [facility];  Service: Endoscopy;  Laterality: N/A;  . Colonoscopy  07/01/2012    Procedure: COLONOSCOPY;  Surgeon: Juanita Craver, MD;  Location: [facility] ENDOSCOPY;  Service: Endoscopy;  Laterality: N/A;    FAMILY HISTORY: Family History  Problem Relation Age of Onset  . Aneurysm Mother   . Heart disease Mother   . Arthritis Father     SOCIAL HISTORY:  History   Social History  . Marital Status: Married    Spouse Name: N/A  . Number of Children: 2  .  Years of Education: 12   Occupational History  . Retired     Social History Main Topics  . Smoking status: Never Smoker   . Smokeless tobacco: Not on file  . Alcohol Use: No  . Drug Use: No  . Sexual Activity: Not on file   Other Topics Concern  . Not on file   Social History Narrative   Lives at home with husband.   Right-handed.   No caffeine use.   PHYSICAL EXAM   Filed Vitals:   11/19/14 0916  BP: 151/79  Pulse: 70  Height: 5\' 2"  (1.575 m)  Weight: 175 lb (79.379 kg)    Not recorded      Body mass index is 32 kg/(m^2).  PHYSICAL EXAMNIATION:  Gen: NAD, conversant, well nourised, obese, well groomed                     Cardiovascular: Regular rate rhythm, no peripheral edema, warm, nontender. Eyes: Conjunctivae clear without exudates or hemorrhage Neck: Supple, no carotid bruise. Pulmonary: Clear to auscultation bilaterally   NEUROLOGICAL EXAM:  MENTAL STATUS: Speech:    Speech is normal; fluent and spontaneous with normal comprehension.  Cognition:    The patient is oriented to person, place, and time;     recent and remote memory intact;     language fluent;     normal attention, concentration,     fund of knowledge.  CRANIAL NERVES: CN II: Visual fields are full to confrontation. Fundoscopic exam is normal with sharp discs and no vascular changes. Venous pulsations are present bilaterally. Pupils are 4 mm and briskly reactive to light.  CN III, IV, VI: extraocular movement are normal. No ptosis. Occasionally horizontal saccade protrusion CN V: Facial sensation is intact to pinprick in all 3 divisions bilaterally. Corneal responses are intact.  CN VII: Face is symmetric with normal eye closure and smile. CN VIII: Hearing is normal to rubbing fingers CN IX, X: Palate elevates symmetrically. Phonation is normal. CN XI: Head turning and shoulder shrug are intact CN XII: Tongue is midline with normal movements and no atrophy.  MOTOR: There is no  pronator drift of out-stretched arms. Muscle bulk and tone are normal. Muscle strength is normal.   Shoulder abduction Shoulder external rotation Elbow flexion Elbow extension Wrist flexion Wrist extension Finger abduction Hip flexion Knee flexion Knee extension Ankle dorsi flexion Ankle plantar flexion  R 5 5 5 5 5 5 5 5 5 5 5 5   L 5 5 5 5 5 5 5 5 5 5 5 5     REFLEXES: Reflexes are 2+ and symmetric at the biceps, triceps, knees, and ankles. Plantar responses are flexor.  SENSORY: Light touch, pinprick, position sense, and vibration sense are intact in fingers and toes.  COORDINATION: Normal finger to nose, heel-to-shin , no truncal ataxia   GAIT/STANCE: She has cautious, wide-based, mildly unsteady gait Romberg is absent.   DIAGNOSTIC DATA (LABS, IMAGING,  TESTING) - I reviewed patient records, labs, notes, testing and imaging myself where available.  Lab Results  Component Value Date   WBC 8.7 11/05/2014   HGB 13.3 11/05/2014   HCT 41.5 11/05/2014   MCV 78* 11/05/2014   PLT 282 11/05/2014      Component Value Date/Time   NA 140 11/05/2014 1555   NA 141 12/15/2009 0456   K 3.6 11/05/2014 1555   CL 96* 11/05/2014 1555   CO2 28 11/05/2014 1555   GLUCOSE 91 11/05/2014 1555   GLUCOSE 97 12/15/2009 0456   BUN 17 11/05/2014 1555   BUN 3* 12/15/2009 0456   CREATININE 1.08* 11/05/2014 1555   CALCIUM 9.3 11/05/2014 1555   PROT 7.0 11/05/2014 1555   PROT 7.6 12/12/2009 2037   ALBUMIN 3.8 12/12/2009 2037   AST 23 11/05/2014 1555   ALT 17 11/05/2014 1555   ALKPHOS 125* 11/05/2014 1555   BILITOT 0.4 11/05/2014 1555   BILITOT 0.7 12/12/2009 2037   GFRNONAA 53* 11/05/2014 1555   GFRAA 61 11/05/2014 1555   No results found for: CHOL, HDL, LDLCALC, LDLDIRECT, TRIG, CHOLHDL No results found for: HGBA1C Lab Results  Component Value Date   VITAMINB12 483 11/05/2014     ASSESSMENT AND PLAN  Lilygrace CINA KLUMPP is a 69 y.o. female  With past medical history of hyperlipidemia,  chronic low back pain, status post stimulator placement, presenting with acute onset of dizziness, vertigo, nausea, unsteady gait, on examinations, initial examination demonstrated trunk more than limb ataxia, this could due to small with cerebellum/brain stem stroke, that can be easily missed by CAT scan,  1, keep Plavix daily 2. Emphasize with her the importance of moderate exercise, 3, referred to neuro-ophthalmologist for evaluation of her occasional saccadic protrusion, likely a chronic problem for her  Marcial Pacas M.D. Ph.D.  [facility] [address] [place], [place] [address] Ph: [phone] Fax: [phone]

## 2014-11-30 DIAGNOSIS — S40019A Contusion of unspecified shoulder, initial encounter: Secondary | ICD-10-CM | POA: Diagnosis not present

## 2014-12-03 DIAGNOSIS — M7752 Other enthesopathy of left foot: Secondary | ICD-10-CM | POA: Diagnosis not present

## 2014-12-03 DIAGNOSIS — M7732 Calcaneal spur, left foot: Secondary | ICD-10-CM | POA: Diagnosis not present

## 2014-12-31 ENCOUNTER — Ambulatory Visit: Payer: Self-pay | Admitting: Neurology

## 2015-01-12 DIAGNOSIS — H538 Other visual disturbances: Secondary | ICD-10-CM | POA: Diagnosis not present

## 2015-01-12 DIAGNOSIS — G250 Essential tremor: Secondary | ICD-10-CM | POA: Diagnosis not present

## 2015-01-12 DIAGNOSIS — H832X9 Labyrinthine dysfunction, unspecified ear: Secondary | ICD-10-CM | POA: Diagnosis not present

## 2015-01-12 DIAGNOSIS — H04129 Dry eye syndrome of unspecified lacrimal gland: Secondary | ICD-10-CM | POA: Diagnosis not present

## 2015-02-13 DIAGNOSIS — R1110 Vomiting, unspecified: Secondary | ICD-10-CM | POA: Diagnosis not present

## 2015-02-13 DIAGNOSIS — R112 Nausea with vomiting, unspecified: Secondary | ICD-10-CM | POA: Diagnosis not present

## 2015-02-13 DIAGNOSIS — R110 Nausea: Secondary | ICD-10-CM | POA: Diagnosis not present

## 2015-02-15 DIAGNOSIS — M7541 Impingement syndrome of right shoulder: Secondary | ICD-10-CM | POA: Diagnosis not present

## 2015-02-18 ENCOUNTER — Ambulatory Visit: Payer: Self-pay | Admitting: Neurology

## 2015-02-19 ENCOUNTER — Ambulatory Visit: Payer: Self-pay | Admitting: Neurology

## 2015-02-23 DIAGNOSIS — I10 Essential (primary) hypertension: Secondary | ICD-10-CM | POA: Diagnosis not present

## 2015-02-23 DIAGNOSIS — R1011 Right upper quadrant pain: Secondary | ICD-10-CM | POA: Diagnosis not present

## 2015-02-23 DIAGNOSIS — R748 Abnormal levels of other serum enzymes: Secondary | ICD-10-CM | POA: Diagnosis not present

## 2015-02-23 DIAGNOSIS — E039 Hypothyroidism, unspecified: Secondary | ICD-10-CM | POA: Diagnosis not present

## 2015-02-23 DIAGNOSIS — D509 Iron deficiency anemia, unspecified: Secondary | ICD-10-CM | POA: Diagnosis not present

## 2015-02-25 DIAGNOSIS — Z6832 Body mass index (BMI) 32.0-32.9, adult: Secondary | ICD-10-CM | POA: Diagnosis not present

## 2015-02-25 DIAGNOSIS — J029 Acute pharyngitis, unspecified: Secondary | ICD-10-CM | POA: Diagnosis not present

## 2015-02-26 DIAGNOSIS — R1011 Right upper quadrant pain: Secondary | ICD-10-CM | POA: Diagnosis not present

## 2015-03-16 ENCOUNTER — Encounter (HOSPITAL_COMMUNITY): Payer: Self-pay | Admitting: Emergency Medicine

## 2015-03-16 ENCOUNTER — Emergency Department (HOSPITAL_COMMUNITY): Payer: Medicare Other

## 2015-03-16 ENCOUNTER — Emergency Department (HOSPITAL_COMMUNITY)
Admission: EM | Admit: 2015-03-16 | Discharge: 2015-03-16 | Disposition: A | Discharge status: Home / Self Care | Payer: Medicare Other | Attending: Emergency Medicine | Admitting: Emergency Medicine

## 2015-03-16 DIAGNOSIS — Z79899 Other long term (current) drug therapy: Secondary | ICD-10-CM | POA: Insufficient documentation

## 2015-03-16 DIAGNOSIS — Z8673 Personal history of transient ischemic attack (TIA), and cerebral infarction without residual deficits: Secondary | ICD-10-CM | POA: Diagnosis not present

## 2015-03-16 DIAGNOSIS — Z7902 Long term (current) use of antithrombotics/antiplatelets: Secondary | ICD-10-CM | POA: Insufficient documentation

## 2015-03-16 DIAGNOSIS — Z8719 Personal history of other diseases of the digestive system: Secondary | ICD-10-CM | POA: Insufficient documentation

## 2015-03-16 DIAGNOSIS — R42 Dizziness and giddiness: Secondary | ICD-10-CM | POA: Diagnosis not present

## 2015-03-16 DIAGNOSIS — Z8541 Personal history of malignant neoplasm of cervix uteri: Secondary | ICD-10-CM | POA: Diagnosis not present

## 2015-03-16 DIAGNOSIS — Z862 Personal history of diseases of the blood and blood-forming organs and certain disorders involving the immune mechanism: Secondary | ICD-10-CM | POA: Insufficient documentation

## 2015-03-16 DIAGNOSIS — R51 Headache: Secondary | ICD-10-CM | POA: Diagnosis not present

## 2015-03-16 HISTORY — DX: Essential (primary) hypertension: I10

## 2015-03-16 HISTORY — DX: Transient cerebral ischemic attack, unspecified: G45.9

## 2015-03-16 LAB — I-STAT CHEM 8, ED
BUN: 10 mg/dL (ref 6–20)
CREATININE: 0.9 mg/dL (ref 0.44–1.00)
Calcium, Ion: 1.21 mmol/L (ref 1.13–1.30)
Chloride: 102 mmol/L (ref 101–111)
GLUCOSE: 97 mg/dL (ref 65–99)
HCT: 42 % (ref 36.0–46.0)
Hemoglobin: 14.3 g/dL (ref 12.0–15.0)
POTASSIUM: 3.8 mmol/L (ref 3.5–5.1)
Sodium: 142 mmol/L (ref 135–145)
TCO2: 26 mmol/L (ref 0–100)

## 2015-03-16 LAB — CBC WITH DIFFERENTIAL/PLATELET
BASOS ABS: 0 10*3/uL (ref 0.0–0.1)
BASOS PCT: 1 % (ref 0–1)
Eosinophils Absolute: 0.1 10*3/uL (ref 0.0–0.7)
Eosinophils Relative: 2 % (ref 0–5)
HCT: 41.4 % (ref 36.0–46.0)
HEMOGLOBIN: 12.9 g/dL (ref 12.0–15.0)
Lymphocytes Relative: 32 % (ref 12–46)
Lymphs Abs: 2.1 10*3/uL (ref 0.7–4.0)
MCH: 25.9 pg — ABNORMAL LOW (ref 26.0–34.0)
MCHC: 31.2 g/dL (ref 30.0–36.0)
MCV: 83.1 fL (ref 78.0–100.0)
MONOS PCT: 6 % (ref 3–12)
Monocytes Absolute: 0.4 10*3/uL (ref 0.1–1.0)
NEUTROS ABS: 4 10*3/uL (ref 1.7–7.7)
NEUTROS PCT: 59 % (ref 43–77)
Platelets: 213 10*3/uL (ref 150–400)
RBC: 4.98 MIL/uL (ref 3.87–5.11)
RDW: 14.4 % (ref 11.5–15.5)
WBC: 6.6 10*3/uL (ref 4.0–10.5)

## 2015-03-16 MED ORDER — MECLIZINE HCL 25 MG PO TABS
25.0000 mg | ORAL_TABLET | Freq: Once | ORAL | Status: AC
  Administered 2015-03-16: 25 mg via ORAL
  Filled 2015-03-16: qty 1

## 2015-03-16 MED ORDER — SODIUM CHLORIDE 0.9 % IV BOLUS (SEPSIS)
1000.0000 mL | Freq: Once | INTRAVENOUS | Status: AC
  Administered 2015-03-16: 1000 mL via INTRAVENOUS

## 2015-03-16 NOTE — ED Notes (Addendum)
Pt to ED with c/o HTN.  St's she had 2 high readings at home.  Pt st's she has taken her meds today.  Pt c/o feeling tired and has slight headache

## 2015-03-16 NOTE — ED Provider Notes (Signed)
CSN: [id number]     Arrival date & time 03/16/15  1546 History   First MD Initiated Contact with Patient 03/16/15 1943     Chief Complaint  Patient presents with  . Hypertension     (Consider location/radiation/quality/duration/timing/severity/associated sxs/prior Treatment) Patient is a 69 y.o. female presenting with hypertension. The history is provided by the patient. No language interpreter was used.  Hypertension Associated symptoms include headaches. Pertinent negatives include no chest pain, chills, fever, numbness, vomiting or weakness.  Ms. Sonntag is a 69 y.o female with a history of hypertension, TIA, DVT (anticoagulated with Plavix) who presents for dizziness and a slight headache with a feeling of pressure in the frontal lobe since yesterday. She states that it is worse with movement and bending over. She describes it as being drunk. Her granddaughter thought that she did not appear well today and took her blood pressure while at home and reported her blood pressure 200/140. They became concerned and thought that she would need to come to the hospital for hypertension. She denies any recent illness, seasonal allergies, ringing in the ear, chest pain, shortness of breath, abdominal pain, nausea, vomiting, diarrhea.  Past Medical History  Diagnosis Date  . Headache(784.0)   . H/O hiatal hernia   . GERD (gastroesophageal reflux disease)   . Anemia   . Depression   . Cervical cancer   . Hemorrhoids   . Difficult intubation   . Diverticulosis   . Dizziness   . TIA (transient ischemic attack)   . Hypertension    Past Surgical History  Procedure Laterality Date  . Bilateral shoulder    . Bladder tack    . Abdominal hysterectomy    . Cervical disc arthroplasty    . Oophorectomy    . Esophagogastroduodenoscopy  07/01/2012    Procedure: ESOPHAGOGASTRODUODENOSCOPY (EGD);  Surgeon: Juanita Craver, MD;  Location: [facility];  Service: Endoscopy;  Laterality: N/A;  . Colonoscopy   07/01/2012    Procedure: COLONOSCOPY;  Surgeon: Juanita Craver, MD;  Location: [facility];  Service: Endoscopy;  Laterality: N/A;   Family History  Problem Relation Age of Onset  . Aneurysm Mother   . Heart disease Mother   . Arthritis Father    Social History  Substance Use Topics  . Smoking status: Never Smoker   . Smokeless tobacco: None  . Alcohol Use: No   OB History    No data available     Review of Systems  Constitutional: Negative for fever and chills.  Respiratory: Negative for shortness of breath.   Cardiovascular: Negative for chest pain and leg swelling.  Gastrointestinal: Negative for vomiting.  Neurological: Positive for dizziness and headaches. Negative for facial asymmetry, speech difficulty, weakness and numbness.  All other systems reviewed and are negative.     Allergies  Sulfa antibiotics and Prednisone  Home Medications   Prior to Admission medications   Medication Sig Start Date End Date Taking? Authorizing Provider  atorvastatin (LIPITOR) 40 MG tablet Take 40 mg by mouth daily.   Yes Historical Provider, MD  clopidogrel (PLAVIX) 75 MG tablet Take 75 mg by mouth daily. 10/09/14  Yes Historical Provider, MD  dicyclomine (BENTYL) 10 MG capsule Take 10 mg by mouth 4 (four) times daily -  before meals and at bedtime. As needed.   Yes Historical Provider, MD  levothyroxine (SYNTHROID, LEVOTHROID) 50 MCG tablet Take 50 mcg by mouth daily before breakfast.   Yes Historical Provider, MD  metoprolol (LOPRESSOR) 50 MG tablet Take  50 mg by mouth 2 (two) times daily.  10/27/14  Yes Historical Provider, MD  oxyCODONE-acetaminophen (PERCOCET/ROXICET) 5-325 MG per tablet Take 1 tablet by mouth every 4 (four) hours as needed for moderate pain or severe pain.   Yes Historical Provider, MD  topiramate (TOPAMAX) 25 MG capsule Take 25 mg by mouth daily as needed. Take 1 tablet by mouth as needed for trimers per patient   Yes Historical Provider, MD  traMADol (ULTRAM) 50 MG  tablet Take 50 mg by mouth every 6 (six) hours as needed for moderate pain.    Yes Historical Provider, MD  venlafaxine (EFFEXOR) 75 MG tablet Take 75 mg by mouth daily.   Yes Historical Provider, MD   BP 169/87 mmHg  Pulse 72  Temp(Src) 99.1 F (37.3 C) (Oral)  Resp 17  Ht 5\' 2"  (1.575 m)  Wt 176 lb 4 oz (79.946 kg)  BMI 32.23 kg/m2  SpO2 100% Physical Exam  Constitutional: She is oriented to person, place, and time. She appears well-developed and well-nourished.  HENT:  Head: Normocephalic and atraumatic.  Eyes: Conjunctivae are normal.  Neck: Normal range of motion. Neck supple.  Cardiovascular: Normal rate, regular rhythm and normal heart sounds.   Pulmonary/Chest: Effort normal and breath sounds normal. No respiratory distress. She has no wheezes. She has no rales.  Abdominal: Soft. There is no tenderness.  Musculoskeletal: Normal range of motion.  Neurological: She is alert and oriented to person, place, and time. She has normal strength. No sensory deficit. She displays a negative Romberg sign. Coordination normal. GCS eye subscore is 4. GCS verbal subscore is 5. GCS motor subscore is 6.  Cranial nerves III through XII intact. Normal heel to shin bilaterally. Normal finger to nose bilaterally.speaking full and clear sentences. No facial droop.  No reproducible dizziness with Trendelenburg.  She has a bilateral upper extremity tremor which is not new.   Skin: Skin is warm and dry.  Nursing note and vitals reviewed.   ED Course  Procedures (including critical care time) Labs Review Labs Reviewed  CBC WITH DIFFERENTIAL/PLATELET - Abnormal; Notable for the following:    MCH 25.9 (*)    All other components within normal limits  I-STAT CHEM 8, ED    Imaging Review Ct Head Wo Contrast  03/16/2015   CLINICAL DATA:  Acute onset of headache and dizziness. Initial encounter.  EXAM: CT HEAD WITHOUT CONTRAST  TECHNIQUE: Contiguous axial images were obtained from the base of the  skull through the vertex without intravenous contrast.  COMPARISON:  CT of the head performed 11/12/2014  FINDINGS: There is no evidence of acute infarction, mass lesion, or intra- or extra-axial hemorrhage on CT.  Prominence of the ventricles and sulci reflects mild to moderate cortical volume loss. Cerebellar atrophy is noted. Scattered periventricular white matter change likely reflects small vessel ischemic microangiopathy.  The brainstem and fourth ventricle are within normal limits. The basal ganglia are unremarkable in appearance. The cerebral hemispheres demonstrate grossly normal gray-white differentiation. No mass effect or midline shift is seen.  There is no evidence of fracture; degenerative change is noted at the right mandibular condylar head. The orbits are within normal limits. A mucus retention cyst or polyp is noted at the left maxillary sinus. The remaining paranasal sinuses and mastoid air cells are well-aerated. No significant soft tissue abnormalities are seen.  IMPRESSION: 1. No acute intracranial pathology seen on CT. 2. Mild to moderate cortical volume loss and scattered small vessel ischemic microangiopathy. 3. Mild  degenerative change at the right temporomandibular joint. 4. Mucus retention cyst or polyp at the left maxillary sinus.   Electronically Signed   By: Garald Balding M.D.   On: 03/16/2015 23:03   I have personally reviewed and evaluated these images and lab results as part of my medical decision-making.   EKG Interpretation None      MDM   Final diagnoses:  Dizziness   Patient presents for dizziness and headache.  She is well appearing and in no acute distress. Her vitals have remained stable and her BP is not concerning. Her labs are unremarkable and CT head shows no evidence of infarction, lesion, or hemorrhage.  Recheck: Patient ambulated with steady gait and no dizziness. She was slightly orthostatic. She stated that she was feeling better. Medications   sodium chloride 0.9 % bolus 1,000 mL (0 mLs Intravenous Stopped 03/16/15 2344)  meclizine (ANTIVERT) tablet 25 mg (25 mg Oral Given 03/16/15 2339)   He had a CTA head and neck 4 months ago which showed no occlusions. She also had a workup and was seen by neurology for dizziness a couple of months ago. She states that she has meclizine at home but has not taken it recently. I believe this is most likely due to benign positional vertigo and not ischemia. I did discuss return precautions with the patient such as confusion, difficulties with speech and gait, and severe headache.She is to follow-up with her PCP and her neurologist for both blood pressure and dizziness. Patient, husband, and daughter agree with the plan.     Ottie Glazier, PA-C 03/17/15 0023  Varney Biles, MD 03/17/15 [phone]

## 2015-03-16 NOTE — Discharge Instructions (Signed)
Dizziness Return for any confusion, weakness, or severe headache. Record your BP twice a day at the same time while seated.  Dizziness is a common problem. It is a feeling of unsteadiness or light-headedness. You may feel like you are about to faint. Dizziness can lead to injury if you stumble or fall. A person of any age group can suffer from dizziness, but dizziness is more common in older adults. CAUSES  Dizziness can be caused by many different things, including:  Middle ear problems.  Standing for too long.  Infections.  An allergic reaction.  Aging.  An emotional response to something, such as the sight of blood.  Side effects of medicines.  Tiredness.  Problems with circulation or blood pressure.  Excessive use of alcohol or medicines, or illegal drug use.  Breathing too fast (hyperventilation).  An irregular heart rhythm (arrhythmia).  A low red blood cell count (anemia).  Pregnancy.  Vomiting, diarrhea, fever, or other illnesses that cause body fluid loss (dehydration).  Diseases or conditions such as Parkinson's disease, high blood pressure (hypertension), diabetes, and thyroid problems.  Exposure to extreme heat. DIAGNOSIS  Your health care provider will ask about your symptoms, perform a physical exam, and perform an electrocardiogram (ECG) to record the electrical activity of your heart. Your health care provider may also perform other heart or blood tests to determine the cause of your dizziness. These may include:  Transthoracic echocardiogram (TTE). During echocardiography, sound waves are used to evaluate how blood flows through your heart.  Transesophageal echocardiogram (TEE).  Cardiac monitoring. This allows your health care provider to monitor your heart rate and rhythm in real time.  Holter monitor. This is a portable device that records your heartbeat and can help diagnose heart arrhythmias. It allows your health care provider to track your heart  activity for several days if needed.  Stress tests by exercise or by giving medicine that makes the heart beat faster. TREATMENT  Treatment of dizziness depends on the cause of your symptoms and can vary greatly. HOME CARE INSTRUCTIONS   Drink enough fluids to keep your urine clear or pale yellow. This is especially important in very hot weather. In older adults, it is also important in cold weather.  Take your medicine exactly as directed if your dizziness is caused by medicines. When taking blood pressure medicines, it is especially important to get up slowly.  Rise slowly from chairs and steady yourself until you feel okay.  In the morning, first sit up on the side of the bed. When you feel okay, stand slowly while holding onto something until you know your balance is fine.  Move your legs often if you need to stand in one place for a long time. Tighten and relax your muscles in your legs while standing.  Have someone stay with you for 1-2 days if dizziness continues to be a problem. Do this until you feel you are well enough to stay alone. Have the person call your health care provider if he or she notices changes in you that are concerning.  Do not drive or use heavy machinery if you feel dizzy.  Do not drink alcohol. SEEK IMMEDIATE MEDICAL CARE IF:   Your dizziness or light-headedness gets worse.  You feel nauseous or vomit.  You have problems talking, walking, or using your arms, hands, or legs.  You feel weak.  You are not thinking clearly or you have trouble forming sentences. It may take a friend or family member to  notice this.  You have chest pain, abdominal pain, shortness of breath, or sweating.  Your vision changes.  You notice any bleeding.  You have side effects from medicine that seems to be getting worse rather than better. MAKE SURE YOU:   Understand these instructions.  Will watch your condition.  Will get help right away if you are not doing well  or get worse. Document Released: 12/27/2000 Document Revised: 07/08/2013 Document Reviewed: 01/20/2011 [facility] Patient Information 2015 [place], [place]. This information is not intended to replace advice given to you by your health care provider. Make sure you discuss any questions you have with your health care provider.

## 2015-03-16 NOTE — ED Notes (Signed)
Pt ambulated around in POD A with steady gait, denies dizziness but states she was a little wobbly but this is normal for her

## 2015-03-18 DIAGNOSIS — D509 Iron deficiency anemia, unspecified: Secondary | ICD-10-CM | POA: Diagnosis not present

## 2015-03-18 DIAGNOSIS — E785 Hyperlipidemia, unspecified: Secondary | ICD-10-CM | POA: Diagnosis not present

## 2015-03-18 DIAGNOSIS — H8113 Benign paroxysmal vertigo, bilateral: Secondary | ICD-10-CM | POA: Diagnosis not present

## 2015-03-18 DIAGNOSIS — E039 Hypothyroidism, unspecified: Secondary | ICD-10-CM | POA: Diagnosis not present

## 2015-03-18 DIAGNOSIS — I10 Essential (primary) hypertension: Secondary | ICD-10-CM | POA: Diagnosis not present

## 2015-04-29 DIAGNOSIS — K5792 Diverticulitis of intestine, part unspecified, without perforation or abscess without bleeding: Secondary | ICD-10-CM | POA: Diagnosis not present

## 2015-04-29 DIAGNOSIS — K921 Melena: Secondary | ICD-10-CM | POA: Diagnosis not present

## 2015-04-29 DIAGNOSIS — Z6832 Body mass index (BMI) 32.0-32.9, adult: Secondary | ICD-10-CM | POA: Diagnosis not present

## 2015-05-01 DIAGNOSIS — R05 Cough: Secondary | ICD-10-CM | POA: Diagnosis not present

## 2015-05-01 DIAGNOSIS — R109 Unspecified abdominal pain: Secondary | ICD-10-CM | POA: Diagnosis not present

## 2015-05-01 DIAGNOSIS — K5732 Diverticulitis of large intestine without perforation or abscess without bleeding: Secondary | ICD-10-CM | POA: Diagnosis not present

## 2015-05-01 DIAGNOSIS — K921 Melena: Secondary | ICD-10-CM | POA: Diagnosis not present

## 2015-05-03 DIAGNOSIS — M67972 Unspecified disorder of synovium and tendon, left ankle and foot: Secondary | ICD-10-CM | POA: Diagnosis not present

## 2015-05-03 DIAGNOSIS — M9262 Juvenile osteochondrosis of tarsus, left ankle: Secondary | ICD-10-CM | POA: Diagnosis not present

## 2015-05-11 DIAGNOSIS — M25672 Stiffness of left ankle, not elsewhere classified: Secondary | ICD-10-CM | POA: Diagnosis not present

## 2015-05-11 DIAGNOSIS — M25572 Pain in left ankle and joints of left foot: Secondary | ICD-10-CM | POA: Diagnosis not present

## 2015-05-11 DIAGNOSIS — R293 Abnormal posture: Secondary | ICD-10-CM | POA: Diagnosis not present

## 2015-05-11 DIAGNOSIS — R2689 Other abnormalities of gait and mobility: Secondary | ICD-10-CM | POA: Diagnosis not present

## 2015-05-11 DIAGNOSIS — M9262 Juvenile osteochondrosis of tarsus, left ankle: Secondary | ICD-10-CM | POA: Diagnosis not present

## 2015-05-11 DIAGNOSIS — M67972 Unspecified disorder of synovium and tendon, left ankle and foot: Secondary | ICD-10-CM | POA: Diagnosis not present

## 2015-05-11 DIAGNOSIS — M6281 Muscle weakness (generalized): Secondary | ICD-10-CM | POA: Diagnosis not present

## 2015-06-03 DIAGNOSIS — M9260 Juvenile osteochondrosis of tarsus, unspecified ankle: Secondary | ICD-10-CM | POA: Diagnosis not present

## 2015-06-03 DIAGNOSIS — M67972 Unspecified disorder of synovium and tendon, left ankle and foot: Secondary | ICD-10-CM | POA: Diagnosis not present

## 2015-06-03 DIAGNOSIS — M25672 Stiffness of left ankle, not elsewhere classified: Secondary | ICD-10-CM | POA: Diagnosis not present

## 2015-06-03 DIAGNOSIS — M6281 Muscle weakness (generalized): Secondary | ICD-10-CM | POA: Diagnosis not present

## 2015-06-03 DIAGNOSIS — M25572 Pain in left ankle and joints of left foot: Secondary | ICD-10-CM | POA: Diagnosis not present

## 2015-06-03 DIAGNOSIS — R293 Abnormal posture: Secondary | ICD-10-CM | POA: Diagnosis not present

## 2015-06-03 DIAGNOSIS — R2689 Other abnormalities of gait and mobility: Secondary | ICD-10-CM | POA: Diagnosis not present

## 2015-07-18 DIAGNOSIS — S060XAA Concussion with loss of consciousness status unknown, initial encounter: Secondary | ICD-10-CM

## 2015-07-18 DIAGNOSIS — S060X9A Concussion with loss of consciousness of unspecified duration, initial encounter: Secondary | ICD-10-CM

## 2015-07-18 HISTORY — DX: Concussion with loss of consciousness of unspecified duration, initial encounter: S06.0X9A

## 2015-07-18 HISTORY — DX: Concussion with loss of consciousness status unknown, initial encounter: S06.0XAA

## 2015-09-03 DIAGNOSIS — G250 Essential tremor: Secondary | ICD-10-CM | POA: Diagnosis not present

## 2015-09-03 DIAGNOSIS — J329 Chronic sinusitis, unspecified: Secondary | ICD-10-CM | POA: Diagnosis not present

## 2015-09-03 DIAGNOSIS — K219 Gastro-esophageal reflux disease without esophagitis: Secondary | ICD-10-CM | POA: Diagnosis not present

## 2015-09-03 DIAGNOSIS — R399 Unspecified symptoms and signs involving the genitourinary system: Secondary | ICD-10-CM | POA: Diagnosis not present

## 2015-09-22 DIAGNOSIS — S99921A Unspecified injury of right foot, initial encounter: Secondary | ICD-10-CM | POA: Diagnosis not present

## 2015-09-22 DIAGNOSIS — M25571 Pain in right ankle and joints of right foot: Secondary | ICD-10-CM | POA: Diagnosis not present

## 2015-09-22 DIAGNOSIS — I10 Essential (primary) hypertension: Secondary | ICD-10-CM | POA: Diagnosis not present

## 2015-09-22 DIAGNOSIS — S79912A Unspecified injury of left hip, initial encounter: Secondary | ICD-10-CM | POA: Diagnosis not present

## 2015-09-22 DIAGNOSIS — S7002XA Contusion of left hip, initial encounter: Secondary | ICD-10-CM | POA: Diagnosis not present

## 2015-09-22 DIAGNOSIS — S8991XA Unspecified injury of right lower leg, initial encounter: Secondary | ICD-10-CM | POA: Diagnosis not present

## 2015-09-22 DIAGNOSIS — M25561 Pain in right knee: Secondary | ICD-10-CM | POA: Diagnosis not present

## 2015-09-22 DIAGNOSIS — S93401A Sprain of unspecified ligament of right ankle, initial encounter: Secondary | ICD-10-CM | POA: Diagnosis not present

## 2015-09-22 DIAGNOSIS — Z8673 Personal history of transient ischemic attack (TIA), and cerebral infarction without residual deficits: Secondary | ICD-10-CM | POA: Diagnosis not present

## 2015-09-22 DIAGNOSIS — E039 Hypothyroidism, unspecified: Secondary | ICD-10-CM | POA: Diagnosis not present

## 2015-09-22 DIAGNOSIS — S8391XA Sprain of unspecified site of right knee, initial encounter: Secondary | ICD-10-CM | POA: Diagnosis not present

## 2015-09-22 DIAGNOSIS — Z79899 Other long term (current) drug therapy: Secondary | ICD-10-CM | POA: Diagnosis not present

## 2015-10-26 DIAGNOSIS — S8991XA Unspecified injury of right lower leg, initial encounter: Secondary | ICD-10-CM | POA: Diagnosis not present

## 2015-12-19 DIAGNOSIS — K625 Hemorrhage of anus and rectum: Secondary | ICD-10-CM | POA: Diagnosis not present

## 2015-12-19 DIAGNOSIS — N390 Urinary tract infection, site not specified: Secondary | ICD-10-CM | POA: Diagnosis not present

## 2015-12-20 DIAGNOSIS — E669 Obesity, unspecified: Secondary | ICD-10-CM | POA: Diagnosis not present

## 2015-12-20 DIAGNOSIS — K5731 Diverticulosis of large intestine without perforation or abscess with bleeding: Secondary | ICD-10-CM | POA: Diagnosis not present

## 2015-12-20 DIAGNOSIS — R112 Nausea with vomiting, unspecified: Secondary | ICD-10-CM | POA: Diagnosis not present

## 2015-12-20 DIAGNOSIS — K5792 Diverticulitis of intestine, part unspecified, without perforation or abscess without bleeding: Secondary | ICD-10-CM | POA: Diagnosis not present

## 2015-12-21 ENCOUNTER — Other Ambulatory Visit: Payer: Self-pay | Admitting: Gastroenterology

## 2015-12-21 DIAGNOSIS — K625 Hemorrhage of anus and rectum: Secondary | ICD-10-CM | POA: Diagnosis not present

## 2015-12-21 DIAGNOSIS — K5900 Constipation, unspecified: Secondary | ICD-10-CM | POA: Diagnosis not present

## 2015-12-21 DIAGNOSIS — K760 Fatty (change of) liver, not elsewhere classified: Secondary | ICD-10-CM | POA: Diagnosis not present

## 2015-12-21 DIAGNOSIS — R1310 Dysphagia, unspecified: Secondary | ICD-10-CM

## 2015-12-27 ENCOUNTER — Other Ambulatory Visit: Payer: Self-pay

## 2015-12-28 ENCOUNTER — Other Ambulatory Visit: Payer: Self-pay

## 2015-12-29 ENCOUNTER — Ambulatory Visit
Admission: RE | Admit: 2015-12-29 | Discharge: 2015-12-29 | Disposition: A | Discharge status: Home / Self Care | Payer: Medicare Other | Source: Ambulatory Visit | Attending: Gastroenterology | Admitting: Gastroenterology

## 2015-12-29 DIAGNOSIS — K219 Gastro-esophageal reflux disease without esophagitis: Secondary | ICD-10-CM | POA: Diagnosis not present

## 2015-12-29 DIAGNOSIS — K449 Diaphragmatic hernia without obstruction or gangrene: Secondary | ICD-10-CM | POA: Diagnosis not present

## 2015-12-29 DIAGNOSIS — R1310 Dysphagia, unspecified: Secondary | ICD-10-CM

## 2016-01-07 ENCOUNTER — Other Ambulatory Visit: Payer: Self-pay | Admitting: Gastroenterology

## 2016-01-11 ENCOUNTER — Encounter (HOSPITAL_COMMUNITY): Payer: Self-pay | Admitting: *Deleted

## 2016-01-14 ENCOUNTER — Ambulatory Visit (HOSPITAL_COMMUNITY): Admission: RE | Admit: 2016-01-14 | Payer: Medicare Other | Source: Ambulatory Visit | Admitting: Gastroenterology

## 2016-01-14 HISTORY — DX: Acute embolism and thrombosis of unspecified deep veins of unspecified lower extremity: I82.409

## 2016-01-14 HISTORY — DX: Cerebral infarction, unspecified: I63.9

## 2016-01-14 SURGERY — ESOPHAGOGASTRODUODENOSCOPY (EGD) WITH PROPOFOL
Anesthesia: Monitor Anesthesia Care

## 2016-01-20 ENCOUNTER — Other Ambulatory Visit: Payer: Self-pay | Admitting: Gastroenterology

## 2016-01-27 DIAGNOSIS — S60811A Abrasion of right wrist, initial encounter: Secondary | ICD-10-CM | POA: Diagnosis not present

## 2016-01-27 DIAGNOSIS — M5489 Other dorsalgia: Secondary | ICD-10-CM | POA: Diagnosis not present

## 2016-01-27 DIAGNOSIS — S199XXA Unspecified injury of neck, initial encounter: Secondary | ICD-10-CM | POA: Diagnosis not present

## 2016-01-27 DIAGNOSIS — G4489 Other headache syndrome: Secondary | ICD-10-CM | POA: Diagnosis not present

## 2016-01-27 DIAGNOSIS — I10 Essential (primary) hypertension: Secondary | ICD-10-CM | POA: Diagnosis not present

## 2016-01-27 DIAGNOSIS — S270XXA Traumatic pneumothorax, initial encounter: Secondary | ICD-10-CM | POA: Diagnosis not present

## 2016-01-27 DIAGNOSIS — S3991XA Unspecified injury of abdomen, initial encounter: Secondary | ICD-10-CM | POA: Diagnosis not present

## 2016-01-27 DIAGNOSIS — Z23 Encounter for immunization: Secondary | ICD-10-CM | POA: Diagnosis not present

## 2016-01-27 DIAGNOSIS — S0083XA Contusion of other part of head, initial encounter: Secondary | ICD-10-CM | POA: Diagnosis not present

## 2016-01-27 DIAGNOSIS — S0003XA Contusion of scalp, initial encounter: Secondary | ICD-10-CM | POA: Diagnosis not present

## 2016-01-27 DIAGNOSIS — T148 Other injury of unspecified body region: Secondary | ICD-10-CM | POA: Diagnosis not present

## 2016-01-27 DIAGNOSIS — S3993XA Unspecified injury of pelvis, initial encounter: Secondary | ICD-10-CM | POA: Diagnosis not present

## 2016-01-27 DIAGNOSIS — Z8673 Personal history of transient ischemic attack (TIA), and cerebral infarction without residual deficits: Secondary | ICD-10-CM | POA: Diagnosis not present

## 2016-01-27 DIAGNOSIS — J939 Pneumothorax, unspecified: Secondary | ICD-10-CM | POA: Diagnosis not present

## 2016-01-27 DIAGNOSIS — S80812A Abrasion, left lower leg, initial encounter: Secondary | ICD-10-CM | POA: Diagnosis not present

## 2016-02-04 DIAGNOSIS — R079 Chest pain, unspecified: Secondary | ICD-10-CM | POA: Diagnosis not present

## 2016-02-04 DIAGNOSIS — M545 Low back pain: Secondary | ICD-10-CM | POA: Diagnosis not present

## 2016-02-04 DIAGNOSIS — W108XXA Fall (on) (from) other stairs and steps, initial encounter: Secondary | ICD-10-CM | POA: Diagnosis not present

## 2016-02-04 DIAGNOSIS — S3992XA Unspecified injury of lower back, initial encounter: Secondary | ICD-10-CM | POA: Diagnosis not present

## 2016-02-04 DIAGNOSIS — M546 Pain in thoracic spine: Secondary | ICD-10-CM | POA: Diagnosis not present

## 2016-02-04 DIAGNOSIS — S270XXA Traumatic pneumothorax, initial encounter: Secondary | ICD-10-CM | POA: Diagnosis not present

## 2016-02-04 DIAGNOSIS — R0781 Pleurodynia: Secondary | ICD-10-CM | POA: Diagnosis not present

## 2016-02-04 DIAGNOSIS — S0083XA Contusion of other part of head, initial encounter: Secondary | ICD-10-CM | POA: Diagnosis not present

## 2016-02-04 DIAGNOSIS — M47814 Spondylosis without myelopathy or radiculopathy, thoracic region: Secondary | ICD-10-CM | POA: Diagnosis not present

## 2016-02-04 DIAGNOSIS — M47816 Spondylosis without myelopathy or radiculopathy, lumbar region: Secondary | ICD-10-CM | POA: Diagnosis not present

## 2016-02-09 DIAGNOSIS — S060X1A Concussion with loss of consciousness of 30 minutes or less, initial encounter: Secondary | ICD-10-CM | POA: Diagnosis not present

## 2016-02-09 DIAGNOSIS — I10 Essential (primary) hypertension: Secondary | ICD-10-CM | POA: Diagnosis not present

## 2016-02-09 DIAGNOSIS — M545 Low back pain: Secondary | ICD-10-CM | POA: Diagnosis not present

## 2016-02-18 ENCOUNTER — Encounter (HOSPITAL_COMMUNITY): Admission: RE | Payer: Self-pay | Source: Ambulatory Visit

## 2016-02-18 ENCOUNTER — Ambulatory Visit (HOSPITAL_COMMUNITY): Admission: RE | Admit: 2016-02-18 | Payer: Medicare Other | Source: Ambulatory Visit | Admitting: Gastroenterology

## 2016-02-18 SURGERY — ESOPHAGOGASTRODUODENOSCOPY (EGD) WITH PROPOFOL
Anesthesia: Monitor Anesthesia Care

## 2016-03-30 ENCOUNTER — Telehealth: Payer: Self-pay | Admitting: Neurology

## 2016-03-30 NOTE — Telephone Encounter (Signed)
Patient called requesting to switch from Dr. Krista Blue to Dr. Jaynee Eagles. Please call [phone].

## 2016-03-30 NOTE — Telephone Encounter (Signed)
Chart reviewed, last visit was May 2016, it is okay to switch Dr. Jaynee Eagles if he she agrees

## 2016-03-31 NOTE — Telephone Encounter (Signed)
Please call patient, related to message from Dr. Jaynee Eagles, [facility], or she may ask her primary care to refer

## 2016-03-31 NOTE — Telephone Encounter (Signed)
Patient has chronic vertigo and dizziness. She has already been evaluated by Dr. Krista Blue. I feel at this point the best of course would be for Dr. Krista Blue to refer her to [facility] vestibular clinic there. I don;t think I would have more to add this has been ongoing fo rover a year now she needs to see [facility]. Thank you.

## 2016-04-03 ENCOUNTER — Other Ambulatory Visit: Payer: Self-pay | Admitting: *Deleted

## 2016-04-03 DIAGNOSIS — R42 Dizziness and giddiness: Secondary | ICD-10-CM

## 2016-04-03 NOTE — Telephone Encounter (Signed)
Left message for a return call

## 2016-04-03 NOTE — Telephone Encounter (Signed)
Spoke to Millard, Therapist, sports and she is going to call patient

## 2016-04-03 NOTE — Telephone Encounter (Signed)
Pt agreeable to referral to the vestibular clinic at [facility].  Referral placed in chart.

## 2016-04-03 NOTE — Telephone Encounter (Signed)
Patient is returning your call.  

## 2016-04-05 DIAGNOSIS — E039 Hypothyroidism, unspecified: Secondary | ICD-10-CM | POA: Diagnosis not present

## 2016-04-05 DIAGNOSIS — E559 Vitamin D deficiency, unspecified: Secondary | ICD-10-CM | POA: Diagnosis not present

## 2016-04-05 DIAGNOSIS — E785 Hyperlipidemia, unspecified: Secondary | ICD-10-CM | POA: Diagnosis not present

## 2016-04-05 DIAGNOSIS — D509 Iron deficiency anemia, unspecified: Secondary | ICD-10-CM | POA: Diagnosis not present

## 2016-04-05 DIAGNOSIS — I10 Essential (primary) hypertension: Secondary | ICD-10-CM | POA: Diagnosis not present

## 2016-04-13 NOTE — Telephone Encounter (Signed)
I attempted to reach the patient without success.  I left a message with PT's phone number to call to schedule an appt.

## 2016-04-13 NOTE — Telephone Encounter (Signed)
[place] scheduling [phone] called to advise the pt is not returning any calls. She said the patient was called 9/21 and has been on automated call list since then which calls 2xday. Sondra Barges said she called her again today but no success. FYI

## 2016-05-03 DIAGNOSIS — Z1389 Encounter for screening for other disorder: Secondary | ICD-10-CM | POA: Diagnosis not present

## 2016-05-03 DIAGNOSIS — Z23 Encounter for immunization: Secondary | ICD-10-CM | POA: Diagnosis not present

## 2016-05-03 DIAGNOSIS — Z9181 History of falling: Secondary | ICD-10-CM | POA: Diagnosis not present

## 2016-06-26 ENCOUNTER — Other Ambulatory Visit: Payer: Self-pay | Admitting: Gastroenterology

## 2016-06-28 ENCOUNTER — Encounter (HOSPITAL_COMMUNITY): Payer: Self-pay

## 2016-06-29 NOTE — H&P (Signed)
[name] Brooks HPI: In the summer the patient complained about dysphagia.  An esophagram was performed and it was significant for the pill being lodged transiently just above the hiatal hernia.  Past Medical History:  Diagnosis Date  . Anemia   . Blood dyscrasia    1976  . Cervical cancer ([place])   . Depression   . Difficult intubation   . Diverticulosis   . Dizziness   . DVT (deep venous thrombosis) ([place])    developed after cardiac catheterization 17 years ago- on PLAVIX  . GERD (gastroesophageal reflux disease)   . H/O hiatal hernia   . Headache(784.0)    not anymore since on plavix  . Hemorrhoids   . Hypertension   . Stroke [facility])    mimi strokes back in the beginning of 2017  . TIA (transient ischemic attack)     Past Surgical History:  Procedure Laterality Date  . ABDOMINAL HYSTERECTOMY    . bilateral shoulder    . bladder tack    . CERVICAL DISC ARTHROPLASTY    . COLONOSCOPY  07/01/2012   Procedure: COLONOSCOPY;  Surgeon: Juanita Craver, MD;  Location: [facility] END[facility]COPY;  Service: Endoscopy;  Laterality: N/A;  . ESOPHAGOGASTRODUODENOSCOPY  07/01/2012   Procedure: ESOPHAGOGASTRODUODENOSCOPY (EGD);  Surgeon: Juanita Craver, MD;  Location: [facility] ENDOSCOPY;  Service: Endoscopy;  Laterality: N/A;  . OOPHORECTOMY      Family History  Problem Relation Age of Onset  . Aneurysm Mother   . Heart disease Mother   . Arthritis Father     Social History:  reports that she has never smoked. She has never used smokeless tobacco. She reports that she does not drink alcohol or use drugs.  Allergies:  Allergies  Allergen Reactions  . Sulfa Antibiotics Hives and Rash    Blisters in groin/perineal area; swelling/rash of face/mouth/tongue.  . Prednisone Other (See Comments)    "Drives me crazy."    Medications: Scheduled: Continuous:  No results found for this or any previous visit (from the past 24 hour(s)).   No results found.  ROS:  As stated above in the HPI otherwise  negative.  There were no vitals taken for this visit.    PE: Gen: NAD, Alert and Oriented HEENT:  [place]/AT, EOMI Neck: Supple, no LAD Lungs: CTA Bilaterally CV: RRR without M/G/R ABM: Soft, NTND, +BS Ext: No C/C/E  Assessment/Plan: 1) Esophageal stricture. 2) Hiatal hernia.  Plan: 1) EGD with dilation.  Jerome Viglione D 06/29/2016, 12:46 PM

## 2016-06-30 ENCOUNTER — Ambulatory Visit (HOSPITAL_COMMUNITY): Payer: Medicare Other | Admitting: Registered Nurse

## 2016-06-30 ENCOUNTER — Encounter (HOSPITAL_COMMUNITY): Payer: Self-pay

## 2016-06-30 ENCOUNTER — Encounter (HOSPITAL_COMMUNITY)
Admission: RE | Disposition: A | Discharge status: Home / Self Care | Payer: Self-pay | Source: Ambulatory Visit | Attending: Gastroenterology

## 2016-06-30 ENCOUNTER — Ambulatory Visit (HOSPITAL_COMMUNITY)
Admission: RE | Admit: 2016-06-30 | Discharge: 2016-06-30 | Disposition: A | Discharge status: Home / Self Care | Payer: Medicare Other | Source: Ambulatory Visit | Attending: Gastroenterology | Admitting: Gastroenterology

## 2016-06-30 DIAGNOSIS — I10 Essential (primary) hypertension: Secondary | ICD-10-CM | POA: Insufficient documentation

## 2016-06-30 DIAGNOSIS — K449 Diaphragmatic hernia without obstruction or gangrene: Secondary | ICD-10-CM | POA: Insufficient documentation

## 2016-06-30 DIAGNOSIS — Z86718 Personal history of other venous thrombosis and embolism: Secondary | ICD-10-CM | POA: Diagnosis not present

## 2016-06-30 DIAGNOSIS — Z8673 Personal history of transient ischemic attack (TIA), and cerebral infarction without residual deficits: Secondary | ICD-10-CM | POA: Diagnosis not present

## 2016-06-30 DIAGNOSIS — K219 Gastro-esophageal reflux disease without esophagitis: Secondary | ICD-10-CM | POA: Insufficient documentation

## 2016-06-30 DIAGNOSIS — I739 Peripheral vascular disease, unspecified: Secondary | ICD-10-CM | POA: Diagnosis not present

## 2016-06-30 DIAGNOSIS — K222 Esophageal obstruction: Secondary | ICD-10-CM | POA: Insufficient documentation

## 2016-06-30 DIAGNOSIS — E785 Hyperlipidemia, unspecified: Secondary | ICD-10-CM | POA: Diagnosis not present

## 2016-06-30 DIAGNOSIS — Z79899 Other long term (current) drug therapy: Secondary | ICD-10-CM | POA: Insufficient documentation

## 2016-06-30 DIAGNOSIS — F329 Major depressive disorder, single episode, unspecified: Secondary | ICD-10-CM | POA: Diagnosis not present

## 2016-06-30 DIAGNOSIS — Z7902 Long term (current) use of antithrombotics/antiplatelets: Secondary | ICD-10-CM | POA: Diagnosis not present

## 2016-06-30 DIAGNOSIS — E669 Obesity, unspecified: Secondary | ICD-10-CM | POA: Insufficient documentation

## 2016-06-30 DIAGNOSIS — E039 Hypothyroidism, unspecified: Secondary | ICD-10-CM | POA: Insufficient documentation

## 2016-06-30 DIAGNOSIS — Z6832 Body mass index (BMI) 32.0-32.9, adult: Secondary | ICD-10-CM | POA: Diagnosis not present

## 2016-06-30 DIAGNOSIS — Z79891 Long term (current) use of opiate analgesic: Secondary | ICD-10-CM | POA: Insufficient documentation

## 2016-06-30 HISTORY — DX: Disease of blood and blood-forming organs, unspecified: D75.9

## 2016-06-30 HISTORY — PX: ESOPHAGOGASTRODUODENOSCOPY (EGD) WITH PROPOFOL: SHX5813

## 2016-06-30 SURGERY — ESOPHAGOGASTRODUODENOSCOPY (EGD) WITH PROPOFOL
Anesthesia: Monitor Anesthesia Care

## 2016-06-30 MED ORDER — LACTATED RINGERS IV SOLN
INTRAVENOUS | Status: DC
  Administered 2016-06-30: 1000 mL via INTRAVENOUS

## 2016-06-30 MED ORDER — PROPOFOL 500 MG/50ML IV EMUL
INTRAVENOUS | Status: DC | PRN
  Administered 2016-06-30: 75 ug/kg/min via INTRAVENOUS

## 2016-06-30 MED ORDER — GLYCOPYRROLATE 0.2 MG/ML IV SOSY
PREFILLED_SYRINGE | INTRAVENOUS | Status: AC
  Filled 2016-06-30: qty 3

## 2016-06-30 MED ORDER — PROPOFOL 10 MG/ML IV BOLUS
INTRAVENOUS | Status: AC
  Filled 2016-06-30: qty 40

## 2016-06-30 MED ORDER — SODIUM CHLORIDE 0.9 % IV SOLN: INTRAVENOUS | Status: DC

## 2016-06-30 MED ORDER — LIDOCAINE 2% (20 MG/ML) 5 ML SYRINGE
INTRAMUSCULAR | Status: AC
Start: 2016-06-30 — End: 2016-06-30
  Filled 2016-06-30: qty 5

## 2016-06-30 MED ORDER — PROPOFOL 10 MG/ML IV BOLUS
INTRAVENOUS | Status: DC | PRN
  Administered 2016-06-30 (×3): 20 mg via INTRAVENOUS
  Administered 2016-06-30: 40 mg via INTRAVENOUS
  Administered 2016-06-30 (×2): 20 mg via INTRAVENOUS

## 2016-06-30 SURGICAL SUPPLY — 14 items

## 2016-06-30 NOTE — Anesthesia Postprocedure Evaluation (Signed)
Anesthesia Post Note  Patient: [name] Brooks  Procedure(s) Performed: Procedure(s) (LRB): ESOPHAGOGASTRODUODENOSCOPY (EGD) WITH PROPOFOL (N/A)  Patient location during evaluation: PACU Anesthesia Type: MAC Level of consciousness: awake and alert and oriented Pain management: pain level controlled Vital Signs Assessment: post-procedure vital signs reviewed and stable Respiratory status: spontaneous breathing, nonlabored ventilation and respiratory function stable Cardiovascular status: stable and blood pressure returned to baseline Postop Assessment: no signs of nausea or vomiting Anesthetic complications: no                 Kynadee Dam A.

## 2016-06-30 NOTE — Discharge Instructions (Signed)

## 2016-06-30 NOTE — Anesthesia Preprocedure Evaluation (Signed)
Anesthesia Evaluation  Patient identified by MRN, date of birth, ID band Patient awake    Reviewed: Allergy & Precautions, NPO status , Patient's Chart, lab work & pertinent test results, reviewed documented beta blocker date and time   History of Anesthesia Complications (+) DIFFICULT AIRWAY and history of anesthetic complications  Airway Mallampati: III  TM Distance: >3 FB Neck ROM: Limited    Dental no notable dental hx. (+) Caps   Pulmonary    Pulmonary exam normal breath sounds clear to auscultation       Cardiovascular hypertension, Pt. on home beta blockers and Pt. on medications + Peripheral Vascular Disease  Normal cardiovascular exam Rhythm:Regular Rate:Normal     Neuro/Psych  Headaches, PSYCHIATRIC DISORDERS Depression TIACVA, No Residual Symptoms    GI/Hepatic Neg liver ROS, hiatal hernia, GERD  Medicated and Controlled,Dysphagia Diverticulosis   Endo/Other  Hypothyroidism Hyperlipidemia Obesity  Renal/GU negative Renal ROS  negative genitourinary   Musculoskeletal DDD S/P cervical fusion   Abdominal (+) + obese,   Peds  Hematology  (+) Blood dyscrasia, anemia ,   Anesthesia Other Findings   Reproductive/Obstetrics Hx/o Cervical Ca                             Anesthesia Physical Anesthesia Plan  ASA: III  Anesthesia Plan: General and MAC   Post-op Pain Management:    Induction: Intravenous  Airway Management Planned: Natural Airway and Nasal Cannula  Additional Equipment:   Intra-op Plan:   Post-operative Plan:   Informed Consent: I have reviewed the patients History and Physical, chart, labs and discussed the procedure including the risks, benefits and alternatives for the proposed anesthesia with the patient or authorized representative who has indicated his/her understanding and acceptance.     Plan Discussed with: Anesthesiologist, CRNA and  Surgeon  Anesthesia Plan Comments:         Anesthesia Quick Evaluation

## 2016-06-30 NOTE — Transfer of Care (Signed)
Immediate Anesthesia Transfer of Care Note  Patient: [name] Brooks  Procedure(s) Performed: Procedure(s): ESOPHAGOGASTRODUODENOSCOPY (EGD) WITH PROPOFOL (N/A)  Patient Location: PACU  Anesthesia Type:MAC  Level of Consciousness:  sedated, patient cooperative and responds to stimulation  Airway & Oxygen Therapy:Patient Spontanous Breathing and Patient connected to face mask oxgen  Post-op Assessment:  Report given to PACU RN and Post -op Vital signs reviewed and stable  Post vital signs:  Reviewed and stable  Last Vitals:  Vitals:   06/30/16 1023 06/30/16 1114  BP: (!) 165/49   Pulse: 65 72  Resp: 15 12  Temp: 0000000 C     Complications: No apparent anesthesia complications

## 2016-06-30 NOTE — Op Note (Signed)
[facility] Patient Name: [name] Brooks Procedure Date: 06/30/2016 MRN: [id number] Attending MD: Carol Ada , MD Date of Birth: [birth year] CSN: [id number] Age: 70 Admit Type: Outpatient Procedure:                Upper GI endoscopy Indications:              Dysphagia, Abnormal UGI series Providers:                Carol Ada, MD, Elmer Ramp. Tilden Dome, RN, Cletis Athens,                            Technician Referring MD:              Medicines:                Propofol per Anesthesia Complications:            No immediate complications. Estimated Blood Loss:     Estimated blood loss: none. Procedure:                Pre-Anesthesia Assessment:                           - Prior to the procedure, a History and Physical                            was performed, and patient medications and                            allergies were reviewed. The patient's tolerance of                            previous anesthesia was also reviewed. The risks                            and benefits of the procedure and the sedation                            options and risks were discussed with the patient.                            All questions were answered, and informed consent                            was obtained. Prior Anticoagulants: The patient has                            taken no previous anticoagulant or antiplatelet                            agents. ASA Grade Assessment: III - A patient with                            severe systemic disease. After reviewing the risks  and benefits, the patient was deemed in                            satisfactory condition to undergo the procedure.                           - Sedation was administered by an anesthesia                            professional. Deep sedation was attained.                           After obtaining informed consent, the endoscope was                            passed under direct vision.  Throughout the                            procedure, the patient's blood pressure, pulse, and                            oxygen saturations were monitored continuously. The                            Endoscope was introduced through the mouth, and                            advanced to the second part of duodenum. The upper                            GI endoscopy was performed with difficulty due to                            the patient's excessive discomfort during the                            procedure. The patient tolerated the procedure                            poorly. Scope In: Scope Out: Findings:      One moderate benign-appearing, intrinsic stenosis was found. This       measured 1.4 cm (inner diameter) x less than one cm (in length) and was       traversed. A guidewire was placed and the scope was withdrawn. Dilation       was performed with a Savary dilator with no resistance at 16 mm and 17       mm. Estimated blood loss: none.      A 3 cm hiatal hernia was present.      The stomach was normal.      The examined duodenum was normal.      The patient coughed severely throughout the entire procedure, despite       suctioning all the secretions from her oral cavity multiple times. This       made the procedure extremely difficult to perform an  dilate. The       intention was to dilate up tot 18 mm, but only the 16 and 17 mm Savary       dilators were used. Inspection after each of the dilations were negative       for any esophageal mucosal tears, which was the intent. Because of the       severe coughing and the lack of any overt mucosal dilation at 17 mm, no       further dilation was pursued. Impression:               - Benign-appearing esophageal stenosis. Dilated.                           - 3 cm hiatal hernia.                           - Normal stomach.                           - Normal examined duodenum.                           - No specimens  collected. Moderate Sedation:      N/A- Per Anesthesia Care Recommendation:           - Patient has a contact number available for                            emergencies. The signs and symptoms of potential                            delayed complications were discussed with the                            patient. Return to normal activities tomorrow.                            Written discharge instructions were provided to the                            patient.                           - Resume previous diet.                           - Continue present medications.                           - Return to GI clinic in 4 weeks with Dr. Collene Mares. Procedure Code(s):        --- Professional ---                           [phone], Esophagogastroduodenoscopy, flexible,                            transoral; with insertion of guide wire followed by  passage of dilator(s) through esophagus over guide                            wire Diagnosis Code(s):        --- Professional ---                           K22.2, Esophageal obstruction                           K44.9, Diaphragmatic hernia without obstruction or                            gangrene                           R13.10, Dysphagia, unspecified                           R93.3, Abnormal findings on diagnostic imaging of                            other parts of digestive tract CPT copyright 2016 American [employer] Association. All rights reserved. The codes documented in this report are preliminary and upon coder review may  be revised to meet current compliance requirements. Carol Ada, MD Carol Ada, MD 06/30/2016 11:10:49 AM This report has been signed electronically. Number of Addenda: 0

## 2016-07-03 ENCOUNTER — Encounter (HOSPITAL_COMMUNITY): Payer: Self-pay | Admitting: Gastroenterology

## 2016-07-06 DIAGNOSIS — E039 Hypothyroidism, unspecified: Secondary | ICD-10-CM | POA: Diagnosis not present

## 2016-07-06 DIAGNOSIS — E785 Hyperlipidemia, unspecified: Secondary | ICD-10-CM | POA: Diagnosis not present

## 2016-07-06 DIAGNOSIS — D509 Iron deficiency anemia, unspecified: Secondary | ICD-10-CM | POA: Diagnosis not present

## 2016-07-06 DIAGNOSIS — I10 Essential (primary) hypertension: Secondary | ICD-10-CM | POA: Diagnosis not present

## 2016-08-21 DIAGNOSIS — R109 Unspecified abdominal pain: Secondary | ICD-10-CM | POA: Diagnosis not present

## 2016-08-21 DIAGNOSIS — R1011 Right upper quadrant pain: Secondary | ICD-10-CM | POA: Diagnosis not present

## 2016-09-19 DIAGNOSIS — Z1231 Encounter for screening mammogram for malignant neoplasm of breast: Secondary | ICD-10-CM | POA: Diagnosis not present

## 2016-09-19 DIAGNOSIS — M8589 Other specified disorders of bone density and structure, multiple sites: Secondary | ICD-10-CM | POA: Diagnosis not present

## 2016-09-25 DIAGNOSIS — R1011 Right upper quadrant pain: Secondary | ICD-10-CM | POA: Diagnosis not present

## 2016-10-11 DIAGNOSIS — D509 Iron deficiency anemia, unspecified: Secondary | ICD-10-CM | POA: Diagnosis not present

## 2016-10-11 DIAGNOSIS — Z139 Encounter for screening, unspecified: Secondary | ICD-10-CM | POA: Diagnosis not present

## 2016-10-11 DIAGNOSIS — E039 Hypothyroidism, unspecified: Secondary | ICD-10-CM | POA: Diagnosis not present

## 2016-10-11 DIAGNOSIS — I10 Essential (primary) hypertension: Secondary | ICD-10-CM | POA: Diagnosis not present

## 2016-10-11 DIAGNOSIS — E785 Hyperlipidemia, unspecified: Secondary | ICD-10-CM | POA: Diagnosis not present

## 2016-10-12 ENCOUNTER — Other Ambulatory Visit: Payer: Self-pay | Admitting: Gastroenterology

## 2016-10-12 DIAGNOSIS — R1310 Dysphagia, unspecified: Secondary | ICD-10-CM

## 2016-10-12 DIAGNOSIS — K219 Gastro-esophageal reflux disease without esophagitis: Secondary | ICD-10-CM | POA: Diagnosis not present

## 2016-10-12 DIAGNOSIS — R112 Nausea with vomiting, unspecified: Secondary | ICD-10-CM | POA: Diagnosis not present

## 2016-10-12 DIAGNOSIS — K449 Diaphragmatic hernia without obstruction or gangrene: Secondary | ICD-10-CM | POA: Diagnosis not present

## 2016-10-19 ENCOUNTER — Other Ambulatory Visit: Payer: Self-pay

## 2016-10-27 ENCOUNTER — Other Ambulatory Visit: Payer: Self-pay

## 2016-11-08 DIAGNOSIS — M545 Low back pain: Secondary | ICD-10-CM | POA: Diagnosis not present

## 2016-11-08 DIAGNOSIS — I10 Essential (primary) hypertension: Secondary | ICD-10-CM | POA: Diagnosis not present

## 2016-11-08 DIAGNOSIS — M5416 Radiculopathy, lumbar region: Secondary | ICD-10-CM | POA: Diagnosis not present

## 2016-11-13 DIAGNOSIS — E039 Hypothyroidism, unspecified: Secondary | ICD-10-CM | POA: Diagnosis not present

## 2016-11-15 ENCOUNTER — Other Ambulatory Visit: Payer: Self-pay | Admitting: Neurosurgery

## 2016-11-15 DIAGNOSIS — M5416 Radiculopathy, lumbar region: Secondary | ICD-10-CM

## 2016-11-28 ENCOUNTER — Ambulatory Visit
Admission: RE | Admit: 2016-11-28 | Discharge: 2016-11-28 | Disposition: A | Discharge status: Home / Self Care | Payer: Medicare Other | Source: Ambulatory Visit | Attending: Neurosurgery | Admitting: Neurosurgery

## 2016-11-28 DIAGNOSIS — M5416 Radiculopathy, lumbar region: Secondary | ICD-10-CM

## 2016-11-28 DIAGNOSIS — M5126 Other intervertebral disc displacement, lumbar region: Secondary | ICD-10-CM | POA: Diagnosis not present

## 2016-11-28 MED ORDER — ONDANSETRON HCL 4 MG/2ML IJ SOLN
4.0000 mg | Freq: Once | INTRAMUSCULAR | Status: AC
  Administered 2016-11-28: 4 mg via INTRAMUSCULAR

## 2016-11-28 MED ORDER — MEPERIDINE HCL 100 MG/ML IJ SOLN
75.0000 mg | Freq: Once | INTRAMUSCULAR | Status: AC
  Administered 2016-11-28: 75 mg via INTRAMUSCULAR

## 2016-11-28 MED ORDER — IOPAMIDOL (ISOVUE-M 200) INJECTION 41%
15.0000 mL | Freq: Once | INTRAMUSCULAR | Status: AC
  Administered 2016-11-28: 15 mL via INTRATHECAL

## 2016-11-28 MED ORDER — DIAZEPAM 5 MG PO TABS
5.0000 mg | ORAL_TABLET | Freq: Once | ORAL | Status: AC
  Administered 2016-11-28: 5 mg via ORAL

## 2016-11-28 MED ORDER — DIAZEPAM 5 MG PO TABS: 5.0000 mg | ORAL_TABLET | Freq: Once | ORAL | Status: DC

## 2016-11-28 MED ORDER — ONDANSETRON HCL 4 MG/2ML IJ SOLN: 4.0000 mg | Freq: Four times a day (QID) | INTRAMUSCULAR | Status: DC | PRN

## 2016-11-28 NOTE — Progress Notes (Signed)
Pt states she has been off Plavix for the past 5 days. She has also been off Tramadol and Effexor for the past 2 days.

## 2016-11-28 NOTE — Discharge Instructions (Signed)
Myelogram Discharge Instructions  1. Go home and rest quietly for the next 24 hours.  It is important to lie flat for the next 24 hours.  Get up only to go to the restroom.  You may lie in the bed or on a couch on your back, your stomach, your left side or your right side.  You may have one pillow under your head.  You may have pillows between your knees while you are on your side or under your knees while you are on your back.  2. DO NOT drive today.  Recline the seat as far back as it will go, while still wearing your seat belt, on the way home.  3. You may get up to go to the bathroom as needed.  You may sit up for 10 minutes to eat.  You may resume your normal diet and medications unless otherwise indicated.  Drink lots of extra fluids today and tomorrow.  4. The incidence of headache, nausea, or vomiting is about 5% (one in 20 patients).  If you develop a headache, lie flat and drink plenty of fluids until the headache goes away.  Caffeinated beverages may be helpful.  If you develop severe nausea and vomiting or a headache that does not go away with flat bed rest, call [phone].  5. You may resume normal activities after your 24 hours of bed rest is over; however, do not exert yourself strongly or do any heavy lifting tomorrow. If when you get up you have a headache when standing, go back to bed and force fluids for another 24 hours.  6. Call your physician for a follow-up appointment.  The results of your myelogram will be sent directly to your physician by the following day.  7. If you have any questions or if complications develop after you arrive home, please call [phone].  Discharge instructions have been explained to the patient.  The patient, or the person responsible for the patient, fully understands these instructions.       YOU MAY RESUME PLAVIX TODAY.    May resume Effexor and Tramadol on Nov 29, 2016, after 9:30 am.

## 2016-11-30 ENCOUNTER — Other Ambulatory Visit: Payer: Medicare Other

## 2017-01-09 ENCOUNTER — Other Ambulatory Visit: Payer: Medicare Other

## 2017-01-11 ENCOUNTER — Other Ambulatory Visit: Payer: Medicare Other

## 2017-01-29 DIAGNOSIS — E039 Hypothyroidism, unspecified: Secondary | ICD-10-CM | POA: Diagnosis not present

## 2017-01-29 DIAGNOSIS — D509 Iron deficiency anemia, unspecified: Secondary | ICD-10-CM | POA: Diagnosis not present

## 2017-01-29 DIAGNOSIS — E785 Hyperlipidemia, unspecified: Secondary | ICD-10-CM | POA: Diagnosis not present

## 2017-01-29 DIAGNOSIS — I10 Essential (primary) hypertension: Secondary | ICD-10-CM | POA: Diagnosis not present

## 2017-01-29 DIAGNOSIS — Z139 Encounter for screening, unspecified: Secondary | ICD-10-CM | POA: Diagnosis not present

## 2017-02-07 DIAGNOSIS — R0609 Other forms of dyspnea: Secondary | ICD-10-CM | POA: Diagnosis not present

## 2017-02-14 DIAGNOSIS — M545 Low back pain: Secondary | ICD-10-CM | POA: Diagnosis not present

## 2017-02-14 DIAGNOSIS — M5126 Other intervertebral disc displacement, lumbar region: Secondary | ICD-10-CM | POA: Diagnosis not present

## 2017-02-14 DIAGNOSIS — M5416 Radiculopathy, lumbar region: Secondary | ICD-10-CM | POA: Diagnosis not present

## 2017-02-14 DIAGNOSIS — M9983 Other biomechanical lesions of lumbar region: Secondary | ICD-10-CM | POA: Diagnosis not present

## 2017-02-15 DIAGNOSIS — R0602 Shortness of breath: Secondary | ICD-10-CM | POA: Diagnosis not present

## 2017-02-15 DIAGNOSIS — R079 Chest pain, unspecified: Secondary | ICD-10-CM | POA: Diagnosis not present

## 2017-02-15 DIAGNOSIS — R0609 Other forms of dyspnea: Secondary | ICD-10-CM | POA: Diagnosis not present

## 2017-04-12 IMAGING — RF DG ESOPHAGUS
12 of 15 series · 19 of 24 positions shown · non-contrast
Comparison: None.

CLINICAL DATA: Dysphagia

EXAM:
ESOPHOGRAM/BARIUM SWALLOW
TECHNIQUE: Single contrast examination was performed using  thin Naz.
FLUOROSCOPY TIME:  Radiation Exposure Index (as provided by the
fluoroscopic device): 58 deciGy per square cm
If the device does not provide the exposure index:
Fluoroscopy Time:  1 minutes 24 seconds
Number of Acquired Images:

[Series 1: run · 2 of 13 slices shown (1 of 12)]
[im 1/13]
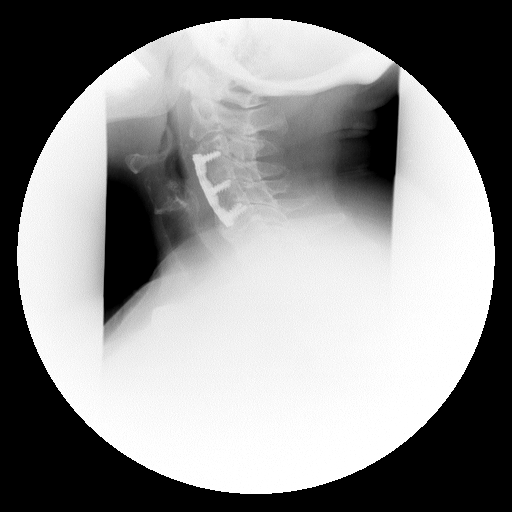
[im 7/13]
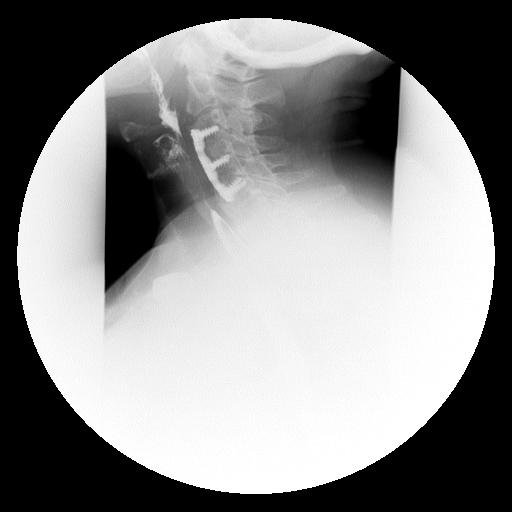

[Series 2: run · 7 of 21 slices shown (2 of 12)]
[im 1/21]
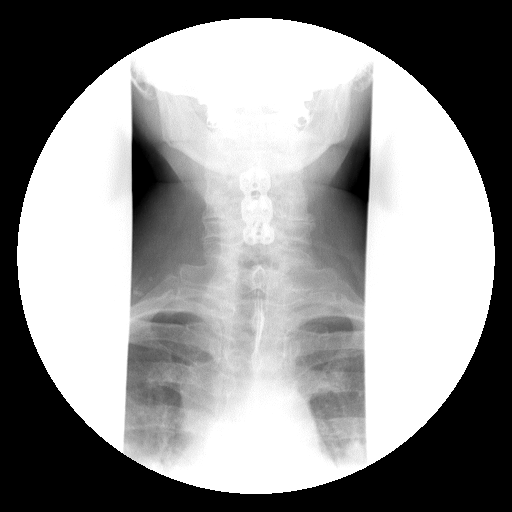
[im 3/21]
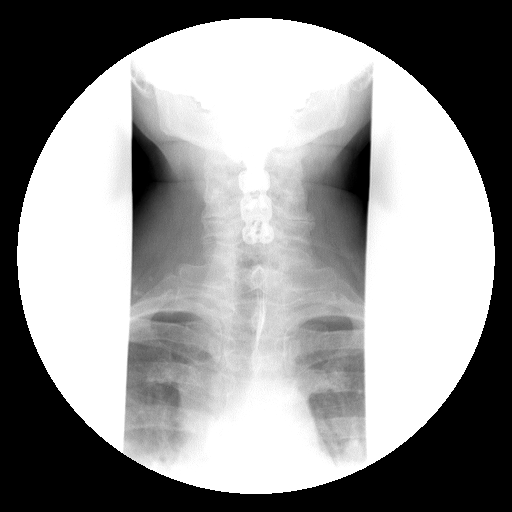
[im 6/21]
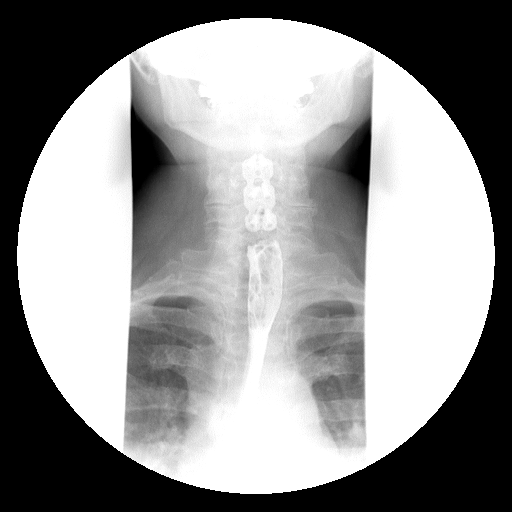
[im 9/21]
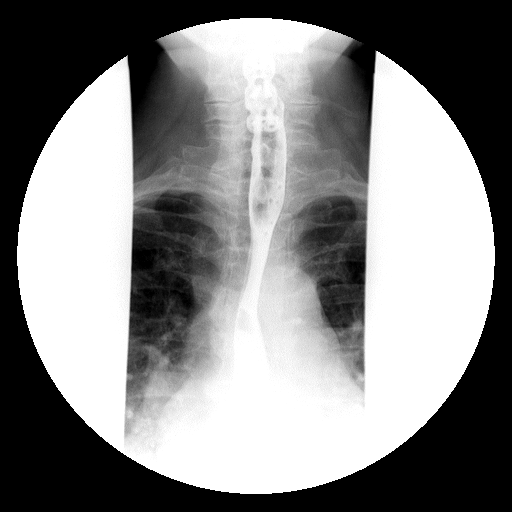
[im 15/21]
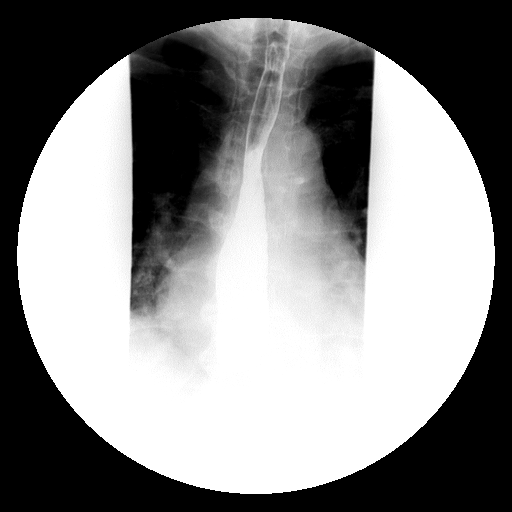
[im 18/21]
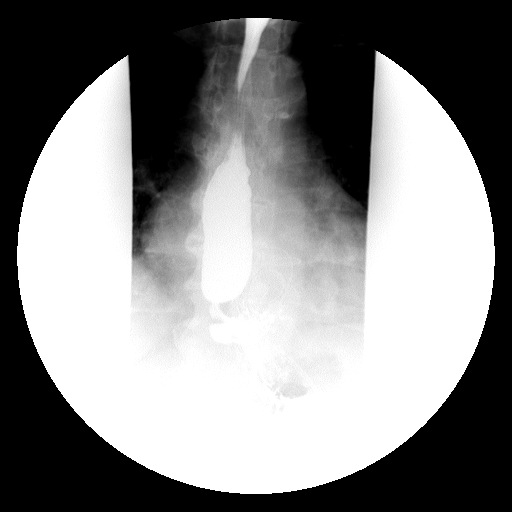
[im 21/21]
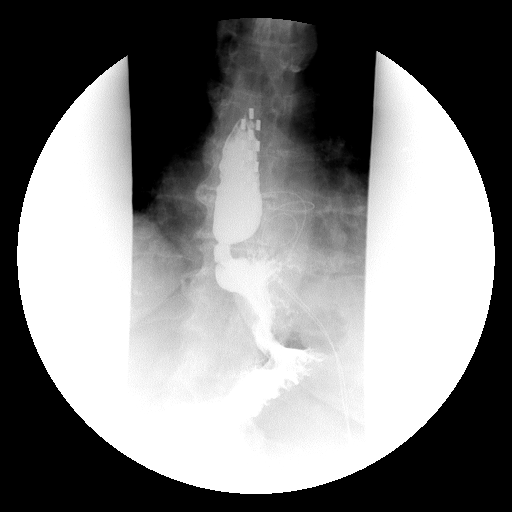

[Series 5: run · 1 of 1 slices shown (3 of 12)]
[im 1/1]
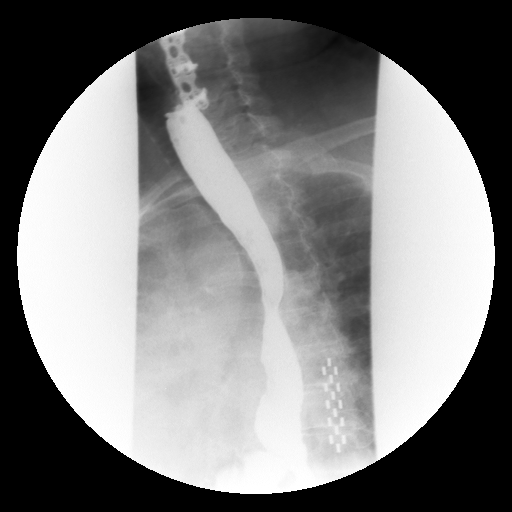

[Series 6: run · 1 of 1 slices shown (4 of 12)]
[im 1/1]
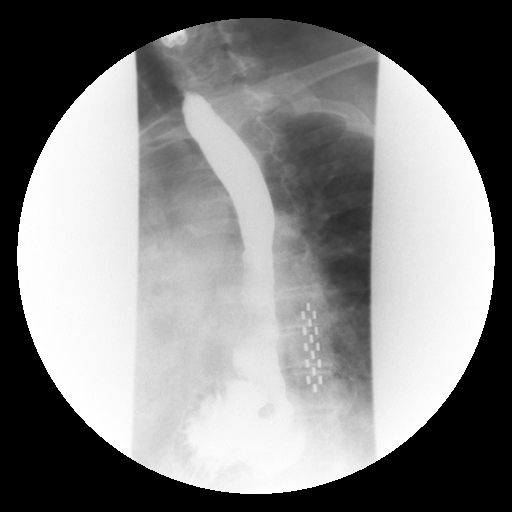

[Series 7: run · 1 of 1 slices shown (5 of 12)]
[im 1/1]
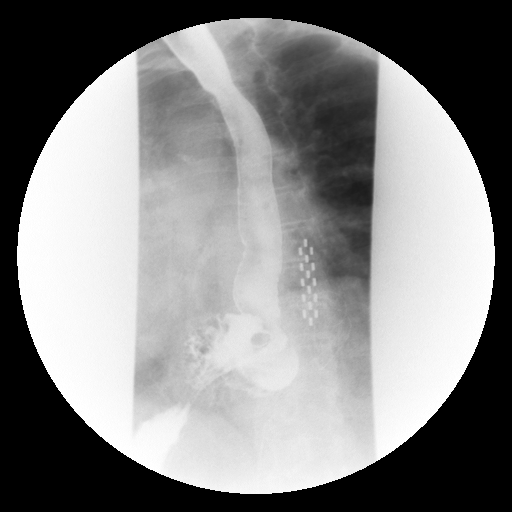

[Series 8: run · 1 of 1 slices shown (6 of 12)]
[im 1/1]
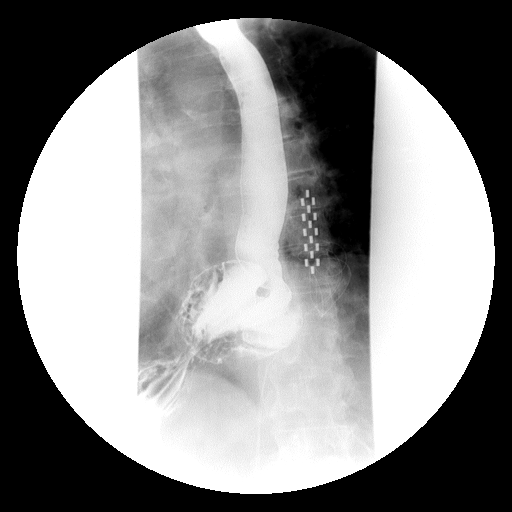

[Series 10: run · 1 of 1 slices shown (7 of 12)]
[im 1/1]
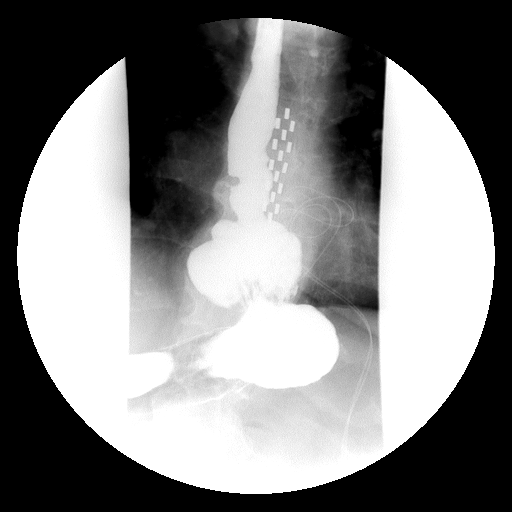

[Series 11: run · 1 of 1 slices shown (8 of 12)]
[im 1/1]
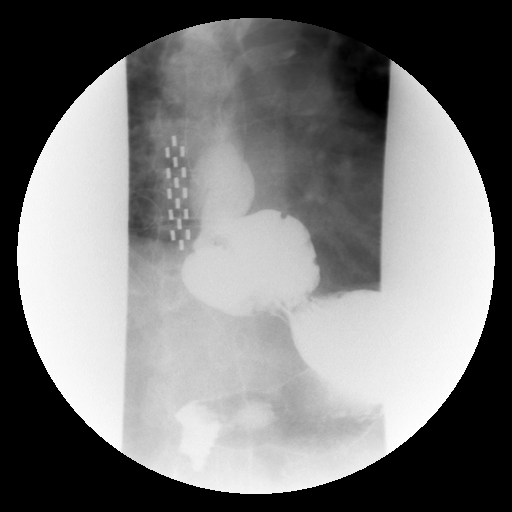

[Series 12: run · 1 of 1 slices shown (9 of 12)]
[im 1/1]
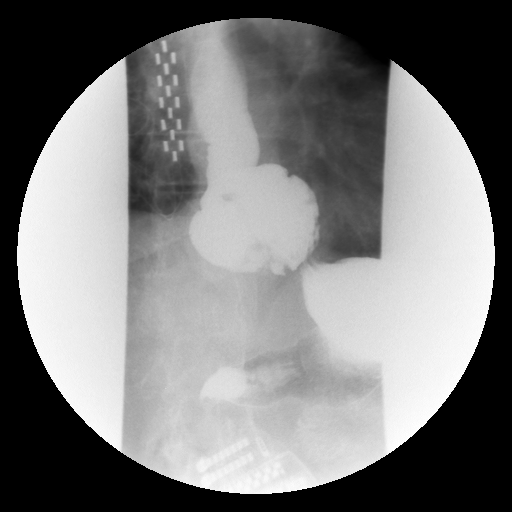

[Series 13: run · 1 of 1 slices shown (10 of 12)]
[im 1/1]
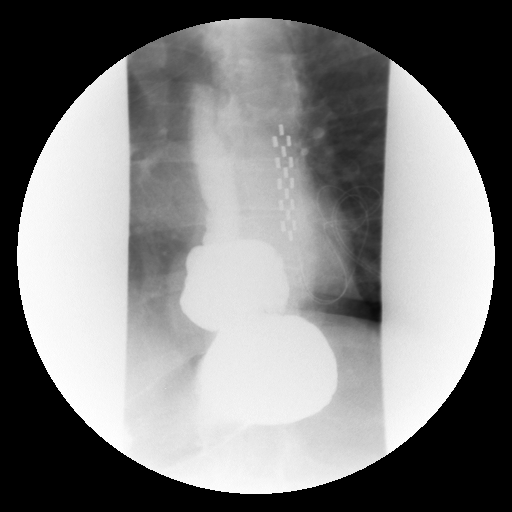

[Series 15: run · 1 of 1 slices shown (11 of 12)]
[im 1/1]
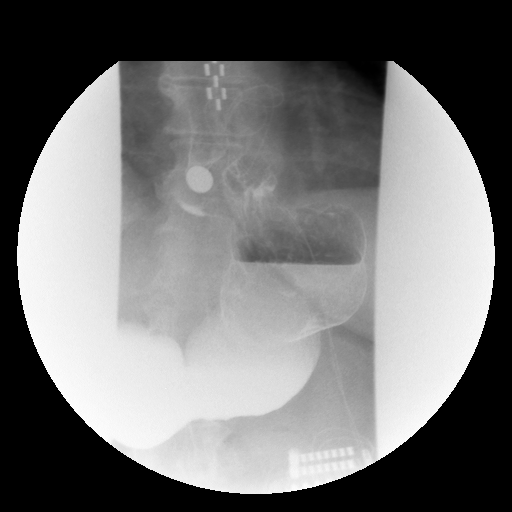

[Series 16: run · 1 of 1 slices shown (12 of 12)]
[im 1/1]
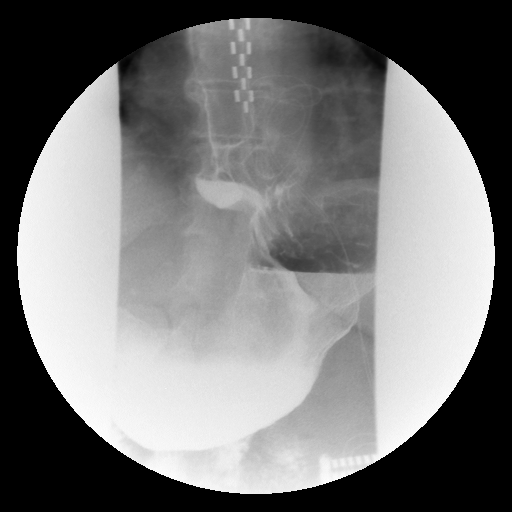

[19 of 24 positions shown; findings below may reference images not displayed]

FINDINGS: With a history of intermittent coughing after swallowing, the
patient was begun in the lateral projection with single-contrast
barium. Rapid sequence spot films show no evidence of aspiration or
penetration. The swallowing mechanism is unremarkable. Only mild
tertiary contractions are noted in the mid and distal esophagus. A
small to moderate size hiatal hernia is present. Moderate
gastroesophageal reflux is demonstrated. A barium pill was given at
the end of the study which did lodge just above the hiatal hernia,
but did pass into the stomach intact after additional barium was
given.
IMPRESSION: 1. Moderate size hiatal hernia with moderate gastroesophageal
reflux.
2. Barium pill lodges just above the hiatal hernia but does pass
intact after additional water and barium were given.
3. Mild tertiary contractions.
4. No aspiration or penetration.

## 2017-04-21 DIAGNOSIS — S060X0A Concussion without loss of consciousness, initial encounter: Secondary | ICD-10-CM | POA: Diagnosis not present

## 2017-04-21 DIAGNOSIS — R51 Headache: Secondary | ICD-10-CM | POA: Diagnosis not present

## 2017-05-07 DIAGNOSIS — I10 Essential (primary) hypertension: Secondary | ICD-10-CM | POA: Diagnosis not present

## 2017-05-07 DIAGNOSIS — E039 Hypothyroidism, unspecified: Secondary | ICD-10-CM | POA: Diagnosis not present

## 2017-05-07 DIAGNOSIS — Z23 Encounter for immunization: Secondary | ICD-10-CM | POA: Diagnosis not present

## 2017-05-07 DIAGNOSIS — E785 Hyperlipidemia, unspecified: Secondary | ICD-10-CM | POA: Diagnosis not present

## 2017-05-07 DIAGNOSIS — Z9181 History of falling: Secondary | ICD-10-CM | POA: Diagnosis not present

## 2017-05-07 DIAGNOSIS — Z1389 Encounter for screening for other disorder: Secondary | ICD-10-CM | POA: Diagnosis not present

## 2017-05-07 DIAGNOSIS — D509 Iron deficiency anemia, unspecified: Secondary | ICD-10-CM | POA: Diagnosis not present

## 2017-06-05 DIAGNOSIS — E039 Hypothyroidism, unspecified: Secondary | ICD-10-CM | POA: Diagnosis not present

## 2017-06-05 DIAGNOSIS — Z7689 Persons encountering health services in other specified circumstances: Secondary | ICD-10-CM | POA: Diagnosis not present

## 2017-07-02 DIAGNOSIS — E039 Hypothyroidism, unspecified: Secondary | ICD-10-CM | POA: Diagnosis not present

## 2017-08-08 ENCOUNTER — Other Ambulatory Visit: Payer: Self-pay | Admitting: Gastroenterology

## 2017-08-08 DIAGNOSIS — R1310 Dysphagia, unspecified: Secondary | ICD-10-CM

## 2017-08-10 ENCOUNTER — Other Ambulatory Visit: Payer: Medicare Other

## 2017-08-24 ENCOUNTER — Other Ambulatory Visit: Payer: Medicare Other

## 2017-08-28 ENCOUNTER — Ambulatory Visit
Admission: RE | Admit: 2017-08-28 | Discharge: 2017-08-28 | Disposition: A | Discharge status: Home / Self Care | Payer: Medicare Other | Source: Ambulatory Visit | Attending: Gastroenterology | Admitting: Gastroenterology

## 2017-08-28 DIAGNOSIS — R1310 Dysphagia, unspecified: Secondary | ICD-10-CM

## 2017-08-28 DIAGNOSIS — K449 Diaphragmatic hernia without obstruction or gangrene: Secondary | ICD-10-CM | POA: Diagnosis not present

## 2017-09-13 DIAGNOSIS — E785 Hyperlipidemia, unspecified: Secondary | ICD-10-CM | POA: Diagnosis not present

## 2017-09-13 DIAGNOSIS — I10 Essential (primary) hypertension: Secondary | ICD-10-CM | POA: Diagnosis not present

## 2017-09-13 DIAGNOSIS — D509 Iron deficiency anemia, unspecified: Secondary | ICD-10-CM | POA: Diagnosis not present

## 2017-09-13 DIAGNOSIS — E039 Hypothyroidism, unspecified: Secondary | ICD-10-CM | POA: Diagnosis not present

## 2017-09-20 ENCOUNTER — Other Ambulatory Visit: Payer: Self-pay | Admitting: Gastroenterology

## 2017-09-20 DIAGNOSIS — Z1211 Encounter for screening for malignant neoplasm of colon: Secondary | ICD-10-CM | POA: Diagnosis not present

## 2017-09-20 DIAGNOSIS — K5730 Diverticulosis of large intestine without perforation or abscess without bleeding: Secondary | ICD-10-CM | POA: Diagnosis not present

## 2017-09-20 DIAGNOSIS — K219 Gastro-esophageal reflux disease without esophagitis: Secondary | ICD-10-CM | POA: Diagnosis not present

## 2017-09-20 DIAGNOSIS — Z86010 Personal history of colonic polyps: Secondary | ICD-10-CM | POA: Diagnosis not present

## 2017-09-20 DIAGNOSIS — R1310 Dysphagia, unspecified: Secondary | ICD-10-CM | POA: Diagnosis not present

## 2017-10-23 ENCOUNTER — Encounter (HOSPITAL_COMMUNITY): Payer: Self-pay

## 2017-10-23 ENCOUNTER — Ambulatory Visit (HOSPITAL_COMMUNITY): Admit: 2017-10-23 | Payer: Medicare Other | Admitting: Gastroenterology

## 2017-10-23 SURGERY — ESOPHAGOGASTRODUODENOSCOPY (EGD) WITH PROPOFOL
Anesthesia: Monitor Anesthesia Care

## 2017-12-19 DIAGNOSIS — I10 Essential (primary) hypertension: Secondary | ICD-10-CM | POA: Diagnosis not present

## 2017-12-19 DIAGNOSIS — Z4542 Encounter for adjustment and management of neuropacemaker (brain) (peripheral nerve) (spinal cord): Secondary | ICD-10-CM | POA: Diagnosis not present

## 2017-12-19 DIAGNOSIS — M545 Low back pain: Secondary | ICD-10-CM | POA: Diagnosis not present

## 2017-12-19 DIAGNOSIS — M5416 Radiculopathy, lumbar region: Secondary | ICD-10-CM | POA: Diagnosis not present

## 2018-01-10 DIAGNOSIS — Z1231 Encounter for screening mammogram for malignant neoplasm of breast: Secondary | ICD-10-CM | POA: Diagnosis not present

## 2018-01-28 DIAGNOSIS — J209 Acute bronchitis, unspecified: Secondary | ICD-10-CM | POA: Diagnosis not present

## 2018-01-29 DIAGNOSIS — J209 Acute bronchitis, unspecified: Secondary | ICD-10-CM | POA: Diagnosis not present

## 2018-01-29 DIAGNOSIS — R05 Cough: Secondary | ICD-10-CM | POA: Diagnosis not present

## 2018-02-20 ENCOUNTER — Other Ambulatory Visit: Payer: Self-pay

## 2018-02-20 NOTE — Patient Outreach (Signed)
[facility]) Care Management  02/20/2018  [name] Brooks Oct 28, 1945 [id number]   Medication Adherence call to [name] Brooks Left a message for patient to call back patient is due on Lisinopril 10 mg. [name] Brooks is showing past due under [place].  [place] Management Direct Dial [phone]  Fax [phone] [name]

## 2018-02-26 ENCOUNTER — Other Ambulatory Visit: Payer: Self-pay

## 2018-02-26 NOTE — Patient Outreach (Signed)
[facility]) Care Management  02/26/2018  [name] Brooks 1946/03/19 [id number]   Medication Adherence call back from [name] Brooks patient ask if we can call [facility] and order her Lisinopril 10 mg patient have more questions regarding her prescription she will be refer to [place] ([facility]ph). [name] Brooks was sshowing past due under [place].  [place] Management Direct Dial [phone]  Fax [phone] [email or web address]

## 2018-02-27 ENCOUNTER — Other Ambulatory Visit: Payer: Self-pay | Admitting: Pharmacist

## 2018-02-27 NOTE — Patient Outreach (Signed)
[facility]) Care Management  02/27/2018  CANDUS BRAUD September 03, 1945 [id number]  72 year old female being followed by [place] for medication adherence, referred to [facility] pharmacist for medication questions.    Successful call placed to Ms. Jodeci Morelos today. HIPAA identifiers verified. Patient has questions about venlafaxine and adverse side effects.  We reviewed medication, dosing, monitoring, and tapering, and I stressed the important of patient keeping in routine communication with medical provider if she wants to make any adjustments to her medications.   Patient voiced understanding.  She reports she has an office visit with provider this month and will discuss with him.  She states she will pick up lisinopril today or tomorrow.    Plan: I will close patient case as no further questions or concerns from patient.  I am happy to assist in the future as needed.   Ralene Bathe, PharmD, [place] [phone]

## 2018-03-05 DIAGNOSIS — Z79899 Other long term (current) drug therapy: Secondary | ICD-10-CM | POA: Diagnosis not present

## 2018-03-05 DIAGNOSIS — D509 Iron deficiency anemia, unspecified: Secondary | ICD-10-CM | POA: Diagnosis not present

## 2018-03-05 DIAGNOSIS — R42 Dizziness and giddiness: Secondary | ICD-10-CM | POA: Diagnosis not present

## 2018-03-08 ENCOUNTER — Other Ambulatory Visit: Payer: Self-pay | Admitting: Gastroenterology

## 2018-03-22 DIAGNOSIS — J208 Acute bronchitis due to other specified organisms: Secondary | ICD-10-CM | POA: Diagnosis not present

## 2018-03-22 DIAGNOSIS — J019 Acute sinusitis, unspecified: Secondary | ICD-10-CM | POA: Diagnosis not present

## 2018-04-01 ENCOUNTER — Other Ambulatory Visit: Payer: Self-pay

## 2018-04-01 NOTE — Patient Outreach (Signed)
[facility]) Care Management  04/01/2018  [name] Brooks 05/03/1946 [id number]   Medication Adherence call to [name] Brooks spoke with patient she said she still has medication on Atorvastatin 40 mg and Lisinopril 10 mg. she will order both medication when she finished with what she has. [name] Brooks is showing past due under [facility] .  [place] Management Direct Dial [phone]  Fax [phone] [email or web address]

## 2018-04-03 ENCOUNTER — Other Ambulatory Visit: Payer: Self-pay

## 2018-04-03 ENCOUNTER — Encounter (HOSPITAL_COMMUNITY): Payer: Self-pay | Admitting: *Deleted

## 2018-04-04 ENCOUNTER — Ambulatory Visit (HOSPITAL_COMMUNITY)
Admission: RE | Admit: 2018-04-04 | Discharge: 2018-04-04 | Disposition: A | Discharge status: Home / Self Care | Payer: Medicare Other | Source: Ambulatory Visit | Attending: Gastroenterology | Admitting: Gastroenterology

## 2018-04-04 ENCOUNTER — Encounter (HOSPITAL_COMMUNITY)
Admission: RE | Disposition: A | Discharge status: Home / Self Care | Payer: Self-pay | Source: Ambulatory Visit | Attending: Gastroenterology

## 2018-04-04 ENCOUNTER — Ambulatory Visit (HOSPITAL_COMMUNITY): Payer: Medicare Other | Admitting: Anesthesiology

## 2018-04-04 ENCOUNTER — Encounter (HOSPITAL_COMMUNITY): Payer: Self-pay

## 2018-04-04 DIAGNOSIS — Z79899 Other long term (current) drug therapy: Secondary | ICD-10-CM | POA: Diagnosis not present

## 2018-04-04 DIAGNOSIS — Z1211 Encounter for screening for malignant neoplasm of colon: Secondary | ICD-10-CM | POA: Diagnosis not present

## 2018-04-04 DIAGNOSIS — K5730 Diverticulosis of large intestine without perforation or abscess without bleeding: Secondary | ICD-10-CM | POA: Diagnosis not present

## 2018-04-04 DIAGNOSIS — F419 Anxiety disorder, unspecified: Secondary | ICD-10-CM | POA: Insufficient documentation

## 2018-04-04 DIAGNOSIS — K644 Residual hemorrhoidal skin tags: Secondary | ICD-10-CM | POA: Insufficient documentation

## 2018-04-04 DIAGNOSIS — E039 Hypothyroidism, unspecified: Secondary | ICD-10-CM | POA: Diagnosis not present

## 2018-04-04 DIAGNOSIS — Z7902 Long term (current) use of antithrombotics/antiplatelets: Secondary | ICD-10-CM | POA: Diagnosis not present

## 2018-04-04 DIAGNOSIS — Z86010 Personal history of colonic polyps: Secondary | ICD-10-CM | POA: Insufficient documentation

## 2018-04-04 DIAGNOSIS — K219 Gastro-esophageal reflux disease without esophagitis: Secondary | ICD-10-CM | POA: Insufficient documentation

## 2018-04-04 DIAGNOSIS — R1310 Dysphagia, unspecified: Secondary | ICD-10-CM | POA: Insufficient documentation

## 2018-04-04 DIAGNOSIS — F329 Major depressive disorder, single episode, unspecified: Secondary | ICD-10-CM | POA: Diagnosis not present

## 2018-04-04 DIAGNOSIS — Z8541 Personal history of malignant neoplasm of cervix uteri: Secondary | ICD-10-CM | POA: Insufficient documentation

## 2018-04-04 DIAGNOSIS — Z79890 Hormone replacement therapy (postmenopausal): Secondary | ICD-10-CM | POA: Insufficient documentation

## 2018-04-04 DIAGNOSIS — K449 Diaphragmatic hernia without obstruction or gangrene: Secondary | ICD-10-CM | POA: Diagnosis not present

## 2018-04-04 DIAGNOSIS — Z86718 Personal history of other venous thrombosis and embolism: Secondary | ICD-10-CM | POA: Diagnosis not present

## 2018-04-04 DIAGNOSIS — I10 Essential (primary) hypertension: Secondary | ICD-10-CM | POA: Diagnosis not present

## 2018-04-04 DIAGNOSIS — K649 Unspecified hemorrhoids: Secondary | ICD-10-CM | POA: Diagnosis not present

## 2018-04-04 DIAGNOSIS — Z8673 Personal history of transient ischemic attack (TIA), and cerebral infarction without residual deficits: Secondary | ICD-10-CM | POA: Insufficient documentation

## 2018-04-04 HISTORY — DX: Concussion with loss of consciousness of unspecified duration, initial encounter: S06.0X9A

## 2018-04-04 HISTORY — PX: COLONOSCOPY WITH PROPOFOL: SHX5780

## 2018-04-04 HISTORY — PX: ESOPHAGOGASTRODUODENOSCOPY (EGD) WITH PROPOFOL: SHX5813

## 2018-04-04 HISTORY — PX: BIOPSY: SHX5522

## 2018-04-04 SURGERY — ESOPHAGOGASTRODUODENOSCOPY (EGD) WITH PROPOFOL
Anesthesia: Monitor Anesthesia Care

## 2018-04-04 MED ORDER — PROPOFOL 500 MG/50ML IV EMUL
INTRAVENOUS | Status: DC | PRN
  Administered 2018-04-04: 100 ug/kg/min via INTRAVENOUS

## 2018-04-04 MED ORDER — LACTATED RINGERS IV SOLN
INTRAVENOUS | Status: DC
  Administered 2018-04-04: 1000 mL via INTRAVENOUS

## 2018-04-04 MED ORDER — SODIUM CHLORIDE 0.9 % IV SOLN: INTRAVENOUS | Status: DC

## 2018-04-04 MED ORDER — PROPOFOL 10 MG/ML IV BOLUS
INTRAVENOUS | Status: AC
  Filled 2018-04-04: qty 60

## 2018-04-04 MED ORDER — PROPOFOL 10 MG/ML IV BOLUS
INTRAVENOUS | Status: DC | PRN
  Administered 2018-04-04: 50 mg via INTRAVENOUS
  Administered 2018-04-04: 20 mg via INTRAVENOUS
  Administered 2018-04-04: 30 mg via INTRAVENOUS
  Administered 2018-04-04: 20 mg via INTRAVENOUS
  Administered 2018-04-04: 30 mg via INTRAVENOUS

## 2018-04-04 MED ORDER — LIDOCAINE 2% (20 MG/ML) 5 ML SYRINGE
INTRAMUSCULAR | Status: DC | PRN
  Administered 2018-04-04: 100 mg via INTRAVENOUS

## 2018-04-04 SURGICAL SUPPLY — 24 items

## 2018-04-04 NOTE — Anesthesia Preprocedure Evaluation (Addendum)
Anesthesia Evaluation  Patient identified by MRN, date of birth, ID band Patient awake    Reviewed: Allergy & Precautions, NPO status , Patient's Chart, lab work & pertinent test results  History of Anesthesia Complications (+) DIFFICULT AIRWAY and history of anesthetic complications (has been given letter and bracelet for hx of difficult intubation)  Airway Mallampati: III  TM Distance: <3 FB Neck ROM: Full    Dental no notable dental hx.    Pulmonary neg pulmonary ROS,    breath sounds clear to auscultation       Cardiovascular hypertension,  Rhythm:Regular Rate:Normal     Neuro/Psych PSYCHIATRIC DISORDERS Anxiety Depression TIA   GI/Hepatic Neg liver ROS, hiatal hernia, GERD  ,  Endo/Other  Hypothyroidism   Renal/GU negative Renal ROS     Musculoskeletal negative musculoskeletal ROS (+)   Abdominal   Peds  Hematology negative hematology ROS (+) Hx of DVT   Anesthesia Other Findings TTE 11/10/14: Study Conclusions  - Left ventricle: The cavity size was normal. Wall thickness was   normal. Systolic function was normal. The estimated ejection   fraction was in the range of 55% to 60%. Regional wall motion   abnormalities cannot be excluded. Doppler parameters are   consistent with abnormal left ventricular relaxation (grade 1   diastolic dysfunction). Doppler parameters are consistent with   high ventricular filling pressure.  Impressions:  - Technically difficult; normal LV function; grade 1 diastolic   dysfunction with elevated LV filling pressure; trace TR.  Reproductive/Obstetrics                          Anesthesia Physical Anesthesia Plan  ASA: III  Anesthesia Plan: MAC   Post-op Pain Management:    Induction: Intravenous  PONV Risk Score and Plan: 2 and Propofol infusion  Airway Management Planned: Natural Airway and Nasal Cannula  Additional Equipment:   Intra-op  Plan:   Post-operative Plan:   Informed Consent: I have reviewed the patients History and Physical, chart, labs and discussed the procedure including the risks, benefits and alternatives for the proposed anesthesia with the patient or authorized representative who has indicated his/her understanding and acceptance.     Plan Discussed with:   Anesthesia Plan Comments:        Anesthesia Quick Evaluation

## 2018-04-04 NOTE — Transfer of Care (Signed)
Immediate Anesthesia Transfer of Care Note  Patient: [name] Brooks  Procedure(s) Performed: Procedure(s): ESOPHAGOGASTRODUODENOSCOPY (EGD) WITH PROPOFOL (N/A) COLONOSCOPY WITH PROPOFOL (N/A) BIOPSY  Patient Location: PACU  Anesthesia Type:MAC  Level of Consciousness: Patient easily awoken, comfortable, cooperative, following commands, responds to stimulation.   Airway & Oxygen Therapy: Patient spontaneously breathing, ventilating well, oxygen via simple oxygen mask.  Post-op Assessment: Report given to PACU RN, vital signs reviewed and stable, moving all extremities.   Post vital signs: Reviewed and stable.  Complications: No apparent anesthesia complications Last Vitals:  Vitals Value Taken Time  BP 138/50 04/04/2018  8:20 AM  Temp 36.4 C 04/04/2018  8:08 AM  Pulse 58 04/04/2018  8:20 AM  Resp 16 04/04/2018  8:20 AM  SpO2 100 % 04/04/2018  8:20 AM  Vitals shown include unvalidated device data.  Last Pain:  Vitals:   04/04/18 0808  TempSrc: Oral  PainSc: 0-No pain         Complications: No apparent anesthesia complications, pt with partial laryngospasm during procedure, recovered quickly from it , decision made between Dr. Christella Hartigan and Dr. Collene Mares and myself to proceed without sedation if patient can tolerate.

## 2018-04-04 NOTE — H&P (Addendum)
[name] Brooks is an 72 y.o. female.   Chief Complaint: Colorectal cancer screening/Abnormal barium swallow-difficulty swallowing. HPI: 72 year old white female here for an EGD/Colonoscopy. See had an abnormal barium swallow done on 08/28/2017 that revealed a smooth tapering at the distal end of the esophagus and a 13 mm barium pill was lodged in the distal esophagus for a while. She has been having difficulty swallowing liquids and solids. Her last colonoscopy was done in 07/01/2012 when she was noted to have scattered diverticulosis and a tubular adenoma was removed. She has a history of being a DIFFICULT INTUBATION. See office notes for details.  Past Medical History:  Diagnosis Date  . Anemia   . Blood dyscrasia    1976, bllod clot in left leg after heart cath  . Cervical cancer ([place]) 1976  . Concussion 2015/11/06  . Depression   . Difficult intubation   . Diverticulosis   . Dizziness    none now 04-03-18  . DVT (deep venous thrombosis) ([place])    developed after cardiac catheterization 17 years ago- on PLAVIX  . GERD (gastroesophageal reflux disease)   . H/O hiatal hernia   . Headache(784.0)    not anymore since on plavix  . Hemorrhoids   . Hypertension   . Stroke [facility])    mimi strokes back in the beginning of November 06, 2015  . TIA (transient ischemic attack)    Past Surgical History:  Procedure Laterality Date  . ABDOMINAL HYSTERECTOMY  1994   complete  . bilateral shoulder    . bladder tack  1994  . CERVICAL DISC ARTHROPLASTY    . COLONOSCOPY  07/01/2012   Procedure: COLONOSCOPY;  Surgeon: Juanita Craver, MD;  Location: [facility]COPY;  Service: Endoscopy;  Laterality: N/A;  . ESOPHAGOGASTRODUODENOSCOPY  07/01/2012   Procedure: ESOPHAGOGASTRODUODENOSCOPY (EGD);  Surgeon: Juanita Craver, MD;  Location: [facility] ENDOS[facility];  Service: Endoscopy;  Laterality: N/A;  . ESOPHAGOGASTRODUODENOSCOPY (EGD) WITH PROPOFOL N/A 06/30/2016   Procedure: ESOPHAGOGASTRODUODENOSCOPY (EGD) WITH PROPOFOL;  Surgeon: Carol Ada, MD;  Location: [facility] ENDOSCOPY;  Service: Endoscopy;  Laterality: N/A;  . OOPHORECTOMY Bilateral   . SPINAL CORD STIMULATOR IMPLANT  05-Nov-2008   batteries dead in lower back   Family History  Problem Relation Age of Onset  . Aneurysm Mother   . Heart disease Mother   . Arthritis Father    Social History:  reports that she has never smoked. She has never used smokeless tobacco. She reports that she does not drink alcohol or use drugs.  Allergies:  Allergies  Allergen Reactions  . Sulfa Antibiotics Hives and Rash    Blisters in groin/perineal area; swelling/rash of face/mouth/tongue.  . Aspirin Other (See Comments)    Ulcerated stomach  . Prednisone Other (See Comments)    "Drives me crazy."   Medications Prior to Admission  Medication Sig Dispense Refill  . atorvastatin (LIPITOR) 40 MG tablet Take 40 mg by mouth daily.    . clonazePAM (KLONOPIN) 0.5 MG tablet Take 0.5 mg by mouth 2 (two) times daily as needed.  2  . clopidogrel (PLAVIX) 75 MG tablet Take 75 mg by mouth daily.  5  . levothyroxine (SYNTHROID, LEVOTHROID) 50 MCG tablet Take 50 mcg by mouth daily before breakfast.    . lisinopril (PRINIVIL,ZESTRIL) 10 MG tablet Take 10 mg by mouth daily.     . metoprolol (LOPRESSOR) 50 MG tablet Take 50 mg by mouth 2 (two) times daily. Reported on 02/04/2016    . Multiple Vitamin (MULTIVITAMIN WITH  MINERALS) TABS tablet Take 1 tablet by mouth daily.    . pantoprazole (PROTONIX) 40 MG tablet Take 40 mg by mouth daily.   5  . traMADol (ULTRAM) 50 MG tablet Take 50 mg by mouth every 6 (six) hours as needed for moderate pain.     Marland Kitchen venlafaxine XR (EFFEXOR-XR) 37.5 MG 24 hr capsule Take 1 capsule by mouth daily with breakfast.     . dicyclomine (BENTYL) 20 MG tablet Take 1 tablet by mouth 4 (four) times daily as needed.  1  . meclizine (ANTIVERT) 25 MG tablet Take 25 mg by mouth every 8 (eight) hours as needed (Inner ear).   1    No results found for this or any previous visit (from the  past 48 hour(s)). No results found.  Review of Systems  Constitutional: Negative.   HENT: Negative.   Eyes: Negative.   Respiratory: Negative.   Cardiovascular: Negative.   Gastrointestinal: Positive for heartburn. Negative for abdominal pain, blood in stool, constipation, diarrhea, melena, nausea and vomiting.  Genitourinary: Negative.   Skin: Negative.   Neurological: Positive for headaches.  Endo/Heme/Allergies: Negative.   Psychiatric/Behavioral: Positive for depression. The patient is nervous/anxious.    Blood pressure (!) 143/60, pulse (!) 59, temperature 97.6 F (36.4 C), temperature source Oral, resp. rate 14, height 5\' 2"  (1.575 m), weight 79.8 kg, SpO2 100 %. Physical Exam  Constitutional: She is oriented to person, place, and time. She appears well-developed and well-nourished.  HENT:  Head: Normocephalic and atraumatic.  Eyes: Pupils are equal, round, and reactive to light. Conjunctivae and EOM are normal.  Neck: Normal range of motion. Neck supple.  Cardiovascular: Normal rate and regular rhythm.  Respiratory: Breath sounds normal.  GI: Soft. Bowel sounds are normal.  Musculoskeletal: Normal range of motion.  Neurological: She is alert and oriented to person, place, and time.  Skin: Skin is warm and dry.  Psychiatric: She has a normal mood and affect. Her behavior is normal. Judgment and thought content normal.   Assessment/Plan Colorectal cancer screening/Dysphagia-proceed with an EGD/Colonoscopy at this time.  Jaidin Richison, MD 04/04/2018, 7:14 AM

## 2018-04-04 NOTE — Anesthesia Postprocedure Evaluation (Signed)
Anesthesia Post Note  Patient: [name] Brooks  Procedure(s) Performed: ESOPHAGOGASTRODUODENOSCOPY (EGD) WITH PROPOFOL (N/A ) COLONOSCOPY WITH PROPOFOL (N/A ) BIOPSY     Patient location during evaluation: PACU Anesthesia Type: MAC Level of consciousness: awake and alert Pain management: pain level controlled Vital Signs Assessment: post-procedure vital signs reviewed and stable Respiratory status: spontaneous breathing, nonlabored ventilation, respiratory function stable and patient connected to nasal cannula oxygen Cardiovascular status: stable and blood pressure returned to baseline Postop Assessment: no apparent nausea or vomiting Anesthetic complications: yes Anesthetic complication details: respiratory eventComments: Laryngospasm during procedure. Resolved with positive pressure. No issues in recovery.    Last Vitals:  Vitals:   04/04/18 0820 04/04/18 0830  BP: (!) 138/50 (!) 145/58  Pulse: 60 (!) 58  Resp: 17 17  Temp:    SpO2: 100% 99%    Last Pain:  Vitals:   04/04/18 0830  TempSrc:   PainSc: 0-No pain                 Lidia Collum

## 2018-04-04 NOTE — Anesthesia Procedure Notes (Signed)
Procedure Name: MAC Date/Time: 04/04/2018 7:27 AM Performed by: Deliah Boston, CRNA Pre-anesthesia Checklist: Patient identified, Emergency Drugs available, Suction available, Patient being monitored and Timeout performed Patient Re-evaluated:Patient Re-evaluated prior to induction Oxygen Delivery Method: Nasal cannula Preoxygenation: Pre-oxygenation with 100% oxygen Placement Confirmation: positive ETCO2,  breath sounds checked- equal and bilateral and CO2 detector

## 2018-04-04 NOTE — Op Note (Signed)
[facility] Patient Name: [name] Brooks Procedure Date: 04/04/2018 MRN: [id number] Attending MD: Juanita Craver , MD Date of Birth: 1945/11/13 CSN: [id number] Age: 72 Admit Type: Outpatient Procedure:                Screening colonoscopy. Indications:              Personal history of colonic polyps; CRC screening                            for colorectal malignant neoplasm Providers:                Juanita Craver, MD, Angus Seller, Cherylynn Ridges,                            Technician, Heide Scales, CRNA Referring MD:             Nelda Bucks, MD Medicines:                Monitored Anesthesia Care Complications:            No immediate complications. Estimated Blood Loss:     Estimated blood loss: none. Procedure:                Pre-anesthesia assessment: - Prior to the                            procedure, a history and physical was performed,                            and patient medications and allergies were                            reviewed. The patient's tolerance of previous                            anesthesia was also reviewed. The risks and                            benefits of the procedure and the sedation options                            and risks were discussed with the patient. All                            questions were answered, and informed consent was                            obtained. Prior anticoagulants: The patient has                            taken Plavix (Clopidogrel), last dose was 5 days                            prior to procedure. ASA Grade Assessment: III - A  patient with severe systemic disease. After                            reviewing the risks and benefits, the patient was                            deemed in satisfactory condition to undergo the                            procedure.                           After obtaining informed consent, the colonoscope                            was passed  under direct vision. Throughout the                            procedure, the patient's blood pressure, pulse, and                            oxygen saturations were monitored continuously. The                            CF-HQ190L ([id number]) Olympus adult colonoscope was                            introduced through the anus and advanced to the the                            cecum, identified by appendiceal orifice and                            ileocecal valve. The colonoscopy was extremely                            difficult due to the patient's medical instability.                            Successful completion of the procedure was aided by                            decreasing the dose of sedation medication. The                            patient tolerated the procedure well. The quality                            of the bowel preparation was adequate. The terminal                            ileum was photographed. The ileocecal valve, the  appendiceal orifice and the rectum were                            photographed. The bowel preparation used was                            GoLYTELY. Scope In: 7:41:27 AM Scope Out: 9:62:95 AM Scope Withdrawal Time: 0 hours 4 minutes 19 seconds  Total Procedure Duration: 0 hours 16 minutes 44 seconds  Findings:      Medium sized external hemorrhoids were found on perianal exam.      A few small-mouthed diverticula were found in the sigmoid colon.      The exam was otherwise without abnormality on direct and retroflexion       views. Impression:               - External hemorrhoids were noted on perianal exam.                           - Few small scattered diverticula in the sigmoid                            colon.                           - The examination was otherwise normal on direct                            and retroflexion views.                           - No specimens collected. Moderate Sedation:       MAC used. Recommendation:           - High fiber diet with augmented water consumption                            daily.                           - Continue present medications.                           - Repeat colonoscopy is not recommended due to                            patients age.                           - Return to GI office PRN.                           - If the patient has any abnormal GI symptoms in                            the interim, she/he has been advised to call the  office ASAP for further recommendations. Procedure Code(s):        --- Professional ---                           [phone], Colonoscopy, flexible; diagnostic, including                            collection of specimen(s) by brushing or washing,                            when performed (separate procedure) Diagnosis Code(s):        --- Professional ---                           K57.30, Diverticulosis of large intestine without                            perforation or abscess without bleeding                           Z86.010, Personal history of colonic polyps                           Z12.11, Encounter for screening for malignant                            neoplasm of colon CPT copyright 2017 [employer]. All rights reserved. The codes documented in this report are preliminary and upon coder review may  be revised to meet current compliance requirements. Juanita Craver, MD Juanita Craver, MD 04/04/2018 8:17:14 AM This report has been signed electronically. Number of Addenda: 0

## 2018-04-04 NOTE — Op Note (Addendum)
[facility] Patient Name: [name] Brooks Procedure Date: 04/04/2018 MRN: [id number] Attending MD: Juanita Craver , MD Date of Birth: 07-Nov-1945 CSN: [id number] Age: 72 Admit Type: Outpatient Procedure:                EGD with esophageal biopsies. Indications:              Dysphagia, Gastro-esophageal reflux disease Providers:                Juanita Craver, MD, Angus Seller RN Janie Billups                            Technician, Heide Scales, CRNA Referring MD:             Nelda Bucks, MD Medicines:                Monitored Anesthesia Care Complications:            No immediate complications. Estimated Blood Loss:     Estimated blood loss was minimal. Procedure:                Pre-anesthesia assessment: - Prior to the                            procedure, a history and physical was performed,                            and patient medications and allergies were                            reviewed. The patient's tolerance of previous                            anesthesia was also reviewed. The risks and                            benefits of the procedure and the sedation options                            and risks were discussed with the patient. All                            questions were answered, and informed consent was                            obtained. Prior anticoagulants: The patient has                            taken Plavix (clopidogrel), last dose was 5 days                            prior to procedure. ASA Grade assessment: III - A                            patient with severe systemic disease that is a  constant threat to life. After reviewing the risks                            and benefits, the patient was deemed in                            satisfactory condition to undergo the procedure.                            After obtaining informed consent, the endoscope was                            passed under direct vision.  Throughout the                            procedure, the patient's blood pressure, pulse, and                            oxygen saturations were monitored continuously. The                            [id number]-[id number] ([id number]) Olympus adult endoscope was                            introduced through the mouth, and advanced to the                            second part of duodenum. The EGD was extremely                            difficult due to the patient's medical instability.                            Successful completion of the procedure was aided by                            managing the patient's medical instability. The                            patient tolerated the procedure well. Scope In: Scope Out: Findings:      Mucosal changes including ringed esophagus were found in the entire       esophagus; esophageal findings were graded using the Eosinophilic       Esophagitis Endoscopic Reference Score (EoE-EREFS) as: Edema Grade 0       Normal (distinct vascular markings), Rings Grade 1 Mild (subtle       circumferential ridges seen on esophageal distension), Exudates Grade 0       None (no white lesions seen), Furrows Grade 0 None (no vertical lines       seen) and Stricture none (no stricture found). Biopsies were taken with       a cold forceps for histology.      A large hiatal hernia was noted on retroflexion; the rest of the stomach       appeared normal.  The examined duodenum was normal. Impression:               - Esophageal mucosal changes suggestive of                            Eosinophilic Esophagitis-biopsies done.                           - Large hiatal hernia; otherwise normal appearing                            stomach.                           - Normal examined duodenum. Moderate Sedation:      MAC used. Recommendation:           - High fiber diet with augmented water consumption                            daily.                           - Continue present  medications.                           - Await pathology results.                           - Return to my office PRN. Procedure Code(s):        --- Professional ---                           [id number], Esophagogastroduodenoscopy, flexible,                            transoral; with biopsy, single or multiple Diagnosis Code(s):        --- Professional ---                           R13.10, Dysphagia, unspecified                           K21.9, Gastro-esophageal reflux disease without                            esophagitis                           K44.9, Diaphragmatic hernia without obstruction or                            gangrene CPT copyright 2017 [employer]. All rights reserved. The codes documented in this report are preliminary and upon coder review may  be revised to meet current compliance requirements. Juanita Craver, MD Juanita Craver, MD 04/04/2018 8:08:54 AM This report has been signed electronically. Number of Addenda: 0

## 2018-04-04 NOTE — Discharge Instructions (Signed)

## 2018-04-07 ENCOUNTER — Encounter (HOSPITAL_COMMUNITY): Payer: Self-pay | Admitting: Gastroenterology

## 2018-06-19 ENCOUNTER — Other Ambulatory Visit: Payer: Self-pay | Admitting: Neurosurgery

## 2018-06-29 DIAGNOSIS — R1013 Epigastric pain: Secondary | ICD-10-CM | POA: Diagnosis not present

## 2018-06-29 DIAGNOSIS — I774 Celiac artery compression syndrome: Secondary | ICD-10-CM | POA: Diagnosis not present

## 2018-06-29 DIAGNOSIS — R490 Dysphonia: Secondary | ICD-10-CM | POA: Diagnosis not present

## 2018-06-29 DIAGNOSIS — E039 Hypothyroidism, unspecified: Secondary | ICD-10-CM | POA: Diagnosis not present

## 2018-06-29 DIAGNOSIS — R109 Unspecified abdominal pain: Secondary | ICD-10-CM | POA: Diagnosis not present

## 2018-06-29 DIAGNOSIS — M545 Low back pain: Secondary | ICD-10-CM | POA: Diagnosis not present

## 2018-06-29 DIAGNOSIS — Z79891 Long term (current) use of opiate analgesic: Secondary | ICD-10-CM | POA: Diagnosis not present

## 2018-06-29 DIAGNOSIS — Z79899 Other long term (current) drug therapy: Secondary | ICD-10-CM | POA: Diagnosis not present

## 2018-06-29 DIAGNOSIS — R42 Dizziness and giddiness: Secondary | ICD-10-CM | POA: Diagnosis not present

## 2018-06-29 DIAGNOSIS — R0781 Pleurodynia: Secondary | ICD-10-CM | POA: Diagnosis not present

## 2018-06-29 DIAGNOSIS — R1031 Right lower quadrant pain: Secondary | ICD-10-CM | POA: Diagnosis not present

## 2018-06-29 DIAGNOSIS — Z8673 Personal history of transient ischemic attack (TIA), and cerebral infarction without residual deficits: Secondary | ICD-10-CM | POA: Diagnosis not present

## 2018-06-29 DIAGNOSIS — I10 Essential (primary) hypertension: Secondary | ICD-10-CM | POA: Diagnosis not present

## 2018-06-29 DIAGNOSIS — G8929 Other chronic pain: Secondary | ICD-10-CM | POA: Diagnosis not present

## 2018-06-29 DIAGNOSIS — Z7902 Long term (current) use of antithrombotics/antiplatelets: Secondary | ICD-10-CM | POA: Diagnosis not present

## 2018-07-01 ENCOUNTER — Other Ambulatory Visit (HOSPITAL_COMMUNITY): Payer: Medicare Other

## 2018-08-01 ENCOUNTER — Ambulatory Visit: Admit: 2018-08-01 | Payer: Medicare Other | Admitting: Neurosurgery

## 2018-08-01 SURGERY — SPINAL CORD STIMULATOR BATTERY EXCHANGE
Anesthesia: General

## 2018-08-13 ENCOUNTER — Encounter: Payer: Medicare Other | Admitting: Vascular Surgery

## 2018-09-03 ENCOUNTER — Encounter: Payer: Medicare Other | Admitting: Vascular Surgery

## 2018-09-10 ENCOUNTER — Ambulatory Visit: Payer: Medicare Other | Admitting: Vascular Surgery

## 2018-09-10 ENCOUNTER — Encounter: Payer: Self-pay | Admitting: Vascular Surgery

## 2018-09-10 VITALS — BP 127/75 | HR 74 | Ht 62.0 in | Wt 176.9 lb

## 2018-09-10 DIAGNOSIS — I774 Celiac artery compression syndrome: Secondary | ICD-10-CM | POA: Diagnosis not present

## 2018-09-10 DIAGNOSIS — I771 Stricture of artery: Secondary | ICD-10-CM

## 2018-09-10 NOTE — Progress Notes (Signed)
Vascular and Vein Specialist of [place]  Patient name: [name] Brooks MRN: [id number] DOB: 1946-07-04 Sex: female  REASON FOR CONSULT: Evaluation incidental finding back artery stenosis  HPI: [name] Brooks is a 73 y.o. female, who is here today for discussion of incidental finding of celiac artery stenosis.  She [facility] to the [facility] emergency department with flank pain.  She had severe shortness of breath associated with this and severe hypertension.  She underwent CT scan and I have this for review.  This showed no evidence of dissection.  She did have narrowing of the celiac artery at its origin.  She specifically denies any mesenteric ischemic symptoms.  She has had intentional weight loss.  She does have a history of reflux from large hiatal hernia.  She reports hip discomfort with walking this appears to be orthopedic with the pain specifically in the hip joints.  She denies cardiac difficulty.  She does have dizziness and is fallen on several occasions  Past Medical History:  Diagnosis Date  . Anemia   . Blood dyscrasia    1976, bllod clot in left leg after heart cath  . Cervical cancer ([place]) 1976  . Concussion 2017  . Depression   . Difficult intubation   . Diverticulosis   . Dizziness    none now 04-03-18  . DVT (deep venous thrombosis) ([place])    developed after cardiac catheterization 17 years ago- on PLAVIX  . GERD (gastroesophageal reflux disease)   . H/O hiatal hernia   . Headache(784.0)    not anymore since on plavix  . Hemorrhoids   . Hypertension   . Stroke [facility])    mimi strokes back in the beginning of 2017  . TIA (transient ischemic attack)     Family History  Problem Relation Age of Onset  . Aneurysm Mother   . Heart disease Mother   . Arthritis Father     SOCIAL HISTORY: Social History   Socioeconomic History  . Marital status: Married    Spouse name: Not on file  . Number of children: 2  . Years of education:  84  . Highest education level: Not on file  Occupational History  . Occupation: Retired  Scientific laboratory technician  . Financial resource strain: Not on file  . Food insecurity:    Worry: Not on file    Inability: Not on file  . Transportation needs:    Medical: Not on file    Non-medical: Not on file  Tobacco Use  . Smoking status: Never Smoker  . Smokeless tobacco: Never Used  Substance and Sexual Activity  . Alcohol use: No  . Drug use: Never  . Sexual activity: Not on file  Lifestyle  . Physical activity:    Days per week: Not on file    Minutes per session: Not on file  . Stress: Not on file  Relationships  . Social connections:    Talks on phone: Not on file    Gets together: Not on file    Attends religious service: Not on file    Active member of club or organization: Not on file    Attends meetings of clubs or organizations: Not on file    Relationship status: Not on file  . Intimate partner violence:    Fear of current or ex partner: Not on file    Emotionally abused: Not on file    Physically abused: Not on file    Forced sexual activity: Not on  file  Other Topics Concern  . Not on file  Social History Narrative   Lives at home with husband.   Right-handed.   No caffeine use.    Allergies  Allergen Reactions  . Sulfa Antibiotics Hives and Rash    Blisters in groin/perineal area; swelling/rash of face/mouth/tongue.  . Aspirin Other (See Comments)    Ulcerated stomach  . Prednisone Other (See Comments)    "Drives me crazy."    Current Outpatient Medications  Medication Sig Dispense Refill  . clonazePAM (KLONOPIN) 0.5 MG tablet Take 0.5 mg by mouth 2 (two) times daily as needed.  2  . clopidogrel (PLAVIX) 75 MG tablet Take 75 mg by mouth daily.  5  . dicyclomine (BENTYL) 20 MG tablet Take 1 tablet by mouth 4 (four) times daily as needed.  1  . lisinopril (PRINIVIL,ZESTRIL) 10 MG tablet Take 10 mg by mouth daily.     . meclizine (ANTIVERT) 25 MG tablet Take 25 mg  by mouth every 8 (eight) hours as needed (Inner ear).   1  . metoprolol (LOPRESSOR) 50 MG tablet Take 50 mg by mouth 2 (two) times daily. Reported on 02/04/2016    . Multiple Vitamin (MULTIVITAMIN WITH MINERALS) TABS tablet Take 1 tablet by mouth daily.    . traMADol (ULTRAM) 50 MG tablet Take 50 mg by mouth every 6 (six) hours as needed for moderate pain.     Marland Kitchen atorvastatin (LIPITOR) 40 MG tablet Take 40 mg by mouth daily.    Marland Kitchen levothyroxine (SYNTHROID, LEVOTHROID) 50 MCG tablet Take 50 mcg by mouth daily before breakfast.    . pantoprazole (PROTONIX) 40 MG tablet Take 40 mg by mouth daily.   5  . venlafaxine XR (EFFEXOR-XR) 37.5 MG 24 hr capsule Take 1 capsule by mouth daily with breakfast.      No current facility-administered medications for this visit.     REVIEW OF SYSTEMS:  [X]  denotes positive finding, [ ]  denotes negative finding Cardiac  Comments:  Chest pain or chest pressure:    Shortness of breath upon exertion:    Short of breath when lying flat:    Irregular heart rhythm:        Vascular    Pain in calf, thigh, or hip brought on by ambulation: x   Pain in feet at night that wakes you up from your sleep:     Blood clot in your veins: x   Leg swelling:  x       Pulmonary    Oxygen at home:    Productive cough:     Wheezing:         Neurologic    Sudden weakness in arms or legs:     Sudden numbness in arms or legs:     Sudden onset of difficulty speaking or slurred speech:    Temporary loss of vision in one eye:     Problems with dizziness:  x       Gastrointestinal    Blood in stool:     Vomited blood:         Genitourinary    Burning when urinating:     Blood in urine:        Psychiatric    Major depression:         Hematologic    Bleeding problems:    Problems with blood clotting too easily:        Skin    Rashes or ulcers:  Constitutional    Fever or chills:      PHYSICAL EXAM: Vitals:   09/10/18 1400  BP: 127/75  Pulse: 74  SpO2:  98%  Weight: 176 lb 14.7 oz (80.3 kg)  Height: 5\' 2"  (1.575 m)    GENERAL: The patient is a well-nourished female, in no acute distress. The vital signs are documented above. CARDIOVASCULAR: Carotid arteries without bruits bilaterally.  2+ radial and 2+ dorsalis pedis pulses.  No abdominal bruit noted PULMONARY: There is good air exchange  ABDOMEN: Soft and non-tender  MUSCULOSKELETAL: There are no major deformities or cyanosis. NEUROLOGIC: No focal weakness or paresthesias are detected. SKIN: There are no ulcers or rashes noted. PSYCHIATRIC: The patient has a normal affect.  DATA:  CT scan was reviewed.  This did show moderate stenosis of her celiac axis at its origin from the aorta.  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  MEDICAL ISSUES: Had long discussion with the patient regarding her incidental finding.  I would not recommend any further imaging or follow-up.  I explained that this was not putting her at any increased risk for bowel ischemia.  Also discussed her hip discomfort.  She has normal pedal pulses and therefore I am not concerned regarding claudication from an arterial standpoint.  She was reviewed and reassured with this discussion will see [place] again on an as-needed basis   Rosetta Posner, MD [facility] Vascular and Vein Specialists of [facility] Tel [phone] Pager [phone]

## 2018-09-11 ENCOUNTER — Encounter: Payer: Self-pay | Admitting: *Deleted

## 2018-10-21 DIAGNOSIS — M47816 Spondylosis without myelopathy or radiculopathy, lumbar region: Secondary | ICD-10-CM | POA: Diagnosis not present

## 2018-10-21 DIAGNOSIS — M5416 Radiculopathy, lumbar region: Secondary | ICD-10-CM | POA: Diagnosis not present

## 2018-11-25 DIAGNOSIS — E039 Hypothyroidism, unspecified: Secondary | ICD-10-CM | POA: Diagnosis not present

## 2018-11-25 DIAGNOSIS — A084 Viral intestinal infection, unspecified: Secondary | ICD-10-CM | POA: Diagnosis not present

## 2018-11-25 DIAGNOSIS — E049 Nontoxic goiter, unspecified: Secondary | ICD-10-CM | POA: Diagnosis not present

## 2018-12-04 ENCOUNTER — Other Ambulatory Visit: Payer: Self-pay

## 2018-12-04 ENCOUNTER — Other Ambulatory Visit: Payer: Self-pay | Admitting: Neurosurgery

## 2018-12-04 NOTE — Patient Outreach (Signed)
[facility]) Care Management  12/04/2018  NOHELANI BENNING 1946/06/12 [id number]   Medication Adherence call to Mrs. Courtni [employer] spoke with patient is due on  Atorvastatin 40 mg patient explain her husband had a surgery and has been at the hospital with him for over a month and did not take her medication during that time,patient has a pill box and has medication at this time. Mrs. Amoroso is showing past due under [place].   [place] Management Direct Dial [phone]  Fax [phone] [email or web address]

## 2018-12-19 ENCOUNTER — Ambulatory Visit: Admit: 2018-12-19 | Payer: Medicare Other | Admitting: Neurosurgery

## 2018-12-19 SURGERY — SPINAL CORD STIMULATOR BATTERY EXCHANGE
Anesthesia: General

## 2018-12-26 DIAGNOSIS — D509 Iron deficiency anemia, unspecified: Secondary | ICD-10-CM | POA: Diagnosis not present

## 2018-12-26 DIAGNOSIS — E538 Deficiency of other specified B group vitamins: Secondary | ICD-10-CM | POA: Diagnosis not present

## 2018-12-26 DIAGNOSIS — R531 Weakness: Secondary | ICD-10-CM | POA: Diagnosis not present

## 2018-12-26 DIAGNOSIS — R7301 Impaired fasting glucose: Secondary | ICD-10-CM | POA: Diagnosis not present

## 2019-01-14 ENCOUNTER — Other Ambulatory Visit: Payer: Self-pay | Admitting: Neurosurgery

## 2019-02-13 NOTE — Pre-Procedure Instructions (Signed)
[id number] [address], [place] [address] Phone: [phone] Fax: [phone]      Your procedure is scheduled on  02-18-19  Report to [facility] Main Entrance "A" at 0530 A.M., and check in at the Admitting office.  Call this number if you have problems the morning of surgery:  [phone]  Call [phone] if you have any questions prior to your surgery date Monday-Friday 8am-4pm    Remember:  Do not eat or drink after midnight the night before your surgery  Take these medicines the morning of surgery with A SIP OF WATER: levothyroxine (SYNTHROID, LEVOTHROID) metoprolol (LOPRESSOR) pantoprazole (PROTONIX) dicyclomine (BENTYL)as needed traMADol (ULTRAM)  Follow your surgeon's instructions on when to stop PLAVIX.  If no instructions were given by your surgeon then you will need to call the office to get those instructions.    7 days prior to surgery STOP taking any Aspirin (unless otherwise instructed by your surgeon), Aleve, Naproxen, Ibuprofen, Motrin, Advil, Goody's, BC's, all herbal medications, fish oil, and all vitamins.    The Morning of Surgery  Do not wear jewelry, make-up or nail polish.  Do not wear lotions, powders, or perfumes or deodorant  Do not shave 48 hours prior to surgery.    Do not bring valuables to the hospital.  [facility] is not responsible for any belongings or valuables.  If you are a smoker, DO NOT Smoke 24 hours prior to surgery IF you wear a CPAP at night please bring your mask, tubing, and machine the morning of surgery   Remember that you must have someone to transport you home after your surgery, and remain with you for 24 hours if you are discharged the same day.   Contacts, glasses, hearing aids, dentures or bridgework may not be worn into surgery.    Leave your suitcase in the car.  After surgery it may be brought to your room.  For patients admitted to the hospital,  discharge time will be determined by your treatment team.  Patients discharged the day of surgery will not be allowed to drive home.    Special instructions:   [place]- Preparing For Surgery  Before surgery, you can play an important role. Because skin is not sterile, your skin needs to be as free of germs as possible. You can reduce the number of germs on your skin by washing with CHG (chlorahexidine gluconate) Soap before surgery.  CHG is an antiseptic cleaner which kills germs and bonds with the skin to continue killing germs even after washing.    Oral Hygiene is also important to reduce your risk of infection.  Remember - BRUSH YOUR TEETH THE MORNING OF SURGERY WITH YOUR REGULAR TOOTHPASTE  Please do not use if you have an allergy to CHG or antibacterial soaps. If your skin becomes reddened/irritated stop using the CHG.  Do not shave (including legs and underarms) for at least 48 hours prior to first CHG shower. It is OK to shave your face.  Please follow these instructions carefully.   1. Shower the NIGHT BEFORE SURGERY and the MORNING OF SURGERY with CHG Soap.   2. If you chose to wash your hair, wash your hair first as usual with your normal shampoo.  3. After you shampoo, rinse your hair and body thoroughly to remove the shampoo.  4. Use CHG as you would any other liquid soap. You can apply CHG directly to the skin and  wash gently with a scrungie or a clean washcloth.   5. Apply the CHG Soap to your body ONLY FROM THE NECK DOWN.  Do not use on open wounds or open sores. Avoid contact with your eyes, ears, mouth and genitals (private parts). Wash Face and genitals (private parts)  with your normal soap.   6. Wash thoroughly, paying special attention to the area where your surgery will be performed.  7. Thoroughly rinse your body with warm water from the neck down.  8. DO NOT shower/wash with your normal soap after using and rinsing off the CHG Soap.  9. Pat yourself dry  with a CLEAN TOWEL.  10. Wear CLEAN PAJAMAS to bed the night before surgery, wear comfortable clothes the morning of surgery  11. Place CLEAN SHEETS on your bed the night of your first shower and DO NOT SLEEP WITH PETS.  Day of Surgery:  Do not apply any deodorants/lotions. Please shower the morning of surgery with the CHG soap  Please wear clean clothes to the hospital/surgery center.   Remember to brush your teeth WITH YOUR REGULAR TOOTHPASTE.   Please read over the following fact sheets that you were given.

## 2019-02-14 ENCOUNTER — Encounter (HOSPITAL_COMMUNITY): Payer: Self-pay

## 2019-02-14 ENCOUNTER — Other Ambulatory Visit (HOSPITAL_COMMUNITY)
Admission: RE | Admit: 2019-02-14 | Discharge: 2019-02-14 | Disposition: A | Discharge status: Home / Self Care | Payer: Medicare Other | Source: Ambulatory Visit | Attending: Neurosurgery | Admitting: Neurosurgery

## 2019-02-14 ENCOUNTER — Encounter (HOSPITAL_COMMUNITY)
Admission: RE | Admit: 2019-02-14 | Discharge: 2019-02-14 | Disposition: A | Discharge status: Home / Self Care | Payer: Medicare Other | Source: Ambulatory Visit | Attending: Neurosurgery | Admitting: Neurosurgery

## 2019-02-14 ENCOUNTER — Other Ambulatory Visit: Payer: Self-pay

## 2019-02-14 DIAGNOSIS — Z01818 Encounter for other preprocedural examination: Secondary | ICD-10-CM | POA: Insufficient documentation

## 2019-02-14 DIAGNOSIS — Z20828 Contact with and (suspected) exposure to other viral communicable diseases: Secondary | ICD-10-CM | POA: Insufficient documentation

## 2019-02-14 LAB — BASIC METABOLIC PANEL
Anion gap: 11 (ref 5–15)
BUN: 9 mg/dL (ref 8–23)
CO2: 26 mmol/L (ref 22–32)
Calcium: 9.3 mg/dL (ref 8.9–10.3)
Chloride: 101 mmol/L (ref 98–111)
Creatinine, Ser: 0.97 mg/dL (ref 0.44–1.00)
GFR calc Af Amer: >60 mL/min (ref >60)
GFR calc non Af Amer: 58 mL/min — ABNORMAL LOW (ref >60)
Glucose, Bld: 94 mg/dL (ref 70–99)
Potassium: 3.4 mmol/L — ABNORMAL LOW (ref 3.5–5.1)
Sodium: 138 mmol/L (ref 135–145)

## 2019-02-14 LAB — CBC
HCT: 42.1 % (ref 36.0–46.0)
Hemoglobin: 12.9 g/dL (ref 12.0–15.0)
MCH: 25.9 pg — ABNORMAL LOW (ref 26.0–34.0)
MCHC: 30.6 g/dL (ref 30.0–36.0)
MCV: 84.5 fL (ref 80.0–100.0)
Platelets: 273 10*3/uL (ref 150–400)
RBC: 4.98 MIL/uL (ref 3.87–5.11)
RDW: 14.8 % (ref 11.5–15.5)
WBC: 9 10*3/uL (ref 4.0–10.5)
nRBC: 0 % (ref 0.0–0.2)

## 2019-02-14 LAB — SURGICAL PCR SCREEN
MRSA, PCR: NEGATIVE
Staphylococcus aureus: POSITIVE — AB

## 2019-02-14 LAB — SARS CORONAVIRUS 2 (TAT 6-24 HRS): SARS Coronavirus 2: NEGATIVE

## 2019-02-15 DIAGNOSIS — T7840XA Allergy, unspecified, initial encounter: Secondary | ICD-10-CM | POA: Diagnosis not present

## 2019-02-15 DIAGNOSIS — T63461A Toxic effect of venom of wasps, accidental (unintentional), initial encounter: Secondary | ICD-10-CM | POA: Diagnosis not present

## 2019-02-17 NOTE — Progress Notes (Signed)
Anesthesia Chart Review:  Case: [id number] Date/Time: 02/18/19 0715   Procedure: Change implantable pulse generator battery with possible lead revision (N/A ) - Change implantable pulse generator battery with possible lead revision   Anesthesia type: General   Pre-op diagnosis: Battery end of life for spinal cord stimulator   Location: [facility] OR ROOM 21 / [facility]   Surgeon: Erline Levine, MD      DISCUSSION: Patient is a 73 year old female scheduled for the above procedure.  History includes never smoker, DIFFICULT INTUBATION (added to history 07/01/12), hiatal hernia, GERD, HTN, concussion (2017), TIA (2017), DVT (post-cath ~ 1976), spinal cord stimulator implant (07/02/08), cervical cancer (1976).  She received a difficult intubation letter following her 07/02/08 surgery. Late this morning, I urgently requested the anesthesia records from [place] (phone [phone], fax [phone])--called again at 4:50 PM, but records received after 5:00 PM did not include the 2009 anesthesia records.   EKG appears stable. Plavix on hold as of 02/13/19. 02/14/19 presurgical COVID-19 test was negative. Anesthesia team to evaluate on the day of surgery.     VS: BP (!) 173/79   Pulse 67   Temp (!) 36.1 C (Oral)   Resp 18   Ht 5\' 3"  (1.6 m)   Wt 78.6 kg   SpO2 100%   BMI 30.68 kg/m    PROVIDERS: Nicoletta Dress, MD is PCP    LABS: Labs reviewed: Acceptable for surgery. (all labs ordered are listed, but only abnormal results are displayed)  Labs Reviewed  SURGICAL PCR SCREEN - Abnormal; Notable for the following components:      Result Value   Staphylococcus aureus POSITIVE (*)    All other components within normal limits  CBC - Abnormal; Notable for the following components:   MCH 25.9 (*)    All other components within normal limits  BASIC METABOLIC PANEL - Abnormal; Notable for the following components:   Potassium 3.4 (*)    GFR calc non Af Amer 58 (*)    All other components  within normal limits     EKG: 02/14/19: Normal sinus rhythm, septal infarct, age undetermined. -Overall I think tracing is not significantly changed since 05/28/03 EKG in [facility].   CV: Echo 11/10/14: Study Conclusions - Left ventricle: The cavity size was normal. Wall thickness was   normal. Systolic function was normal. The estimated ejection   fraction was in the range of 55% to 60%. Regional wall motion   abnormalities cannot be excluded. Doppler parameters are   consistent with abnormal left ventricular relaxation (grade 1   diastolic dysfunction). Doppler parameters are consistent with   high ventricular filling pressure. Impressions: - Technically difficult; normal LV function; grade 1 diastolic   dysfunction with elevated LV filling pressure; trace TR.   Past Medical History:  Diagnosis Date  . Anemia   . Blood dyscrasia    1976, bllod clot in left leg after heart cath  . Cervical cancer ([place]) 1976  . Concussion 2017  . Depression   . Difficult intubation   . Diverticulosis   . Dizziness    none now 04-03-18  . DVT (deep venous thrombosis) ([place])    developed after cardiac catheterization 17 years ago- on PLAVIX  . GERD (gastroesophageal reflux disease)   . H/O hiatal hernia   . Headache(784.0)    not anymore since on plavix  . Hemorrhoids   . Hypertension   . Stroke [facility])    mimi strokes back in the beginning of  11/08/2015  . TIA (transient ischemic attack)     Past Surgical History:  Procedure Laterality Date  . ABDOMINAL HYSTERECTOMY  1994   complete  . bilateral shoulder    . BIOPSY  04/04/2018   Procedure: BIOPSY;  Surgeon: Juanita Craver, MD;  Location: [facility];  Service: Endoscopy;;  . bladder tack  1994  . CERVICAL DISC ARTHROPLASTY    . COLONOSCOPY  07/01/2012   Procedure: COLONOSCOPY;  Surgeon: Juanita Craver, MD;  Location: [facility];  Service: Endoscopy;  Laterality: N/A;  . COLONOSCOPY WITH PROPOFOL N/A 04/04/2018   Procedure: COLONOSCOPY WITH  PROPOFOL;  Surgeon: Juanita Craver, MD;  Location: [facility];  Service: Endoscopy;  Laterality: N/A;  . ESOPHAGOGASTRODUODENOSCOPY  07/01/2012   Procedure: ESOPHAGOGASTRODUODENOSCOPY (EGD);  Surgeon: Juanita Craver, MD;  Location: [facility];  Service: Endoscopy;  Laterality: N/A;  . ESOPHAGOGASTRODUODENOSCOPY (EGD) WITH PROPOFOL N/A 06/30/2016   Procedure: ESOPHAGOGASTRODUODENOSCOPY (EGD) WITH PROPOFOL;  Surgeon: Carol Ada, MD;  Location: [facility];  Service: Endoscopy;  Laterality: N/A;  . ESOPHAGOGASTRODUODENOSCOPY (EGD) WITH PROPOFOL N/A 04/04/2018   Procedure: ESOPHAGOGASTRODUODENOSCOPY (EGD) WITH PROPOFOL;  Surgeon: Juanita Craver, MD;  Location: [facility];  Service: Endoscopy;  Laterality: N/A;  . OOPHORECTOMY Bilateral   . SPINAL CORD STIMULATOR IMPLANT  07-Nov-2008   batteries dead in lower back    MEDICATIONS: . atorvastatin (LIPITOR) 40 MG tablet  . cholecalciferol (VITAMIN D3) 25 MCG (1000 UT) tablet  . clonazePAM (KLONOPIN) 0.5 MG tablet  . clopidogrel (PLAVIX) 75 MG tablet  . dicyclomine (BENTYL) 20 MG tablet  . ferrous sulfate 325 (65 FE) MG tablet  . levothyroxine (SYNTHROID, LEVOTHROID) 50 MCG tablet  . lisinopril (PRINIVIL,ZESTRIL) 10 MG tablet  . meclizine (ANTIVERT) 25 MG tablet  . metoprolol (LOPRESSOR) 50 MG tablet  . pantoprazole (PROTONIX) 40 MG tablet  . traMADol (ULTRAM) 50 MG tablet  . triamterene-hydrochlorothiazide (MAXZIDE-25) 37.5-25 MG tablet  . venlafaxine XR (EFFEXOR-XR) 37.5 MG 24 hr capsule   No current facility-administered medications for this encounter.     Myra Gianotti, PA-C Surgical Short Stay/Anesthesiology [facility] Phone [phone] [facility] Phone [phone] 02/17/2019 5:24 PM

## 2019-02-17 NOTE — H&P (Signed)
Patient ID:   [id number] Patient: [name] Brooks  Date of Birth: 1945-07-25 Visit Type: Office Visit   Date: 12/19/2017 11:00 AM Provider: Marchia Meiers. Vertell Limber MD   This 73 year old female presents for Low back pain.  HISTORY OF PRESENT ILLNESS:  1.  Low back pain  Patient returns to discuss treatment options.  Spinal cord stimulator placed in 2009 has not been operating in approximately 1 year.  Patient does not recall if the device was working since her 2 falls - once down the stairwell from the attic, another landing on dumbbells in the garage.  She reports persistent lumbar pain, occasionally buttock pain.  She notes increased symptoms with walking and housework.  She notes some leg pain bilaterally.  At present, IPG cannot be interrogated as the battery is completely dead - verified by Fuller Song with [employer].   X-rays on [place]  The radiographs do not demonstrate any discontinuity of implanted spinal cord stimulator and extensions.  Since her IPG is not working at all we are not able to get any improvement in her complaints.  The [employer] representative has recommended changing the implantable pulse generator.  We will do so and will make sure that there is no discontinuity or high impedance.  If this is the case then she may need her entire system revised.  The patient is aware of this and wishes to proceed.         Medical/Surgical/Interim History Reviewed, no change.  Last detailed document date:02/14/2017.     PAST MEDICAL HISTORY, SURGICAL HISTORY, FAMILY HISTORY, SOCIAL HISTORY AND REVIEW OF SYSTEMS I have reviewed the patient's past medical, surgical, family and social history as well as the comprehensive review of systems as included on the [facility] history form dated 02/14/2017, which I have signed.  Family History:  Reviewed, no changes.  Last detailed document date:02/14/2017.   Social History: Reviewed, no changes. Last detailed  document date: 02/14/2017.    MEDICATIONS: (added, continued or stopped this visit) Started Medication Directions Instruction Stopped   Aleve 220 mg tablet take 1 tablet by oral route  every 12 hours as needed     atorvastatin 40 mg tablet take 1 tablet by oral route  every day     Effexor XR 37.5 mg capsule,extended release take 1 capsule by oral route  every day with food     lisinopril 5 mg tablet take 1 tablet by oral route  every day    02/16/2016 meclizine 25 mg tablet take 1 tablet by oral route 3 times every day as needed     metoprolol succinate ER 50 mg tablet,extended release 24 hr take 1 tablet by oral route  every day     pantoprazole 40 mg tablet,delayed release take 1 tablet by oral route  every day    10/08/2017 Percocet 5 mg-325 mg tablet take 1 tablet by oral route 2 -3 times per day as needed for pain     Plavix 75 mg tablet take 1 tablet by oral route  every day     Tirosint 50 mcg capsule take 1 capsule by oral route  every day       ALLERGIES: Ingredient Reaction Medication Name Comment  SULFA (SULFONAMIDE ANTIBIOTICS) Rash    PREDNISONE   Hyperactive   Reviewed, no changes.    PHYSICAL EXAM:   Vitals Date Temp F BP Pulse Ht In Wt Lb BMI BSA Pain Score  12/19/2017  153/85 67 62 173  31.64  3/10      IMPRESSION:   Nonfunctional implantable pulse generator with spinal cord stimulator dysfunction as a result  Patient is on an anti-coagulant, anti-inflammatory or supplement that may increase bleeding time. Patient advised to stop medicine prior to surgery.  Comments:  Pt ill need to stop Plavix prior to surgery  PLAN:  Revision of implantable pulse generator and possible lead and paddle revision.  Patient is aware of risks and benefits of surgery and wishes to proceed.  She will need to stop Plavix prior to surgery  Orders: Diagnostic Procedures: Assessment Procedure  M54.16 Lumbar Spine- AP/Lat/Flex/Ex  Z45.42 Thoracic Spine- AP/Lat   Instruction(s)/Education: Assessment Instruction  I10 Hypertension education  Z68.31 Dietary management education, guidance, and counseling   Completed Orders (this encounter) Order Details Reason Side Interpretation Result Initial Treatment Date Region  Lumbar Spine- AP/Lat/Flex/Ex 1 of 2     12/19/2017 All Levels to All Levels  Thoracic Spine- AP/Lat 2 of 2     12/19/2017 All Levels to All Levels  Hypertension education Patient to follow up with primary care provider.        Dietary management education, guidance, and counseling patient encouraged to eat a well balanced diet         Assessment/Plan   # Detail Type Description   1. Assessment Low back pain (M54.5).       2. Assessment Radiculopathy, lumbar region (M54.16).       3. Assessment Battery end of life of spinal cord stimulator (Z45.42).       4. Assessment Essential (primary) hypertension (I10).       5. Assessment Body mass index (BMI) 31.0-31.9, adult (Z68.31).   Plan Orders Today's instructions / counseling include(s) Dietary management education, guidance, and counseling.         Pain Management Plan Pain Scale: 3/10. Method: Numeric Pain Intensity Scale. Location: low back. Onset: 11/02/2014. Duration: varies. Quality: discomforting. Pain management follow-up plan of care: Patient is to use pain medication as directed.              Provider:  Vertell Limber MD, Marchia Meiers 12/21/2017 10:08 AM  Dictation edited by: Marchia Meiers. Vertell Limber    CC Providers: [facility] [address] [place],  [place]  [address]-   Caroline Prochnau  [facility] [address] [place], [place] [address]-              Electronically signed by Marchia Meiers Vertell Limber MD on 12/21/2017 10:08 AM

## 2019-02-17 NOTE — Anesthesia Preprocedure Evaluation (Addendum)
Anesthesia Evaluation  Patient identified by MRN, date of birth, ID band Patient awake    Reviewed: Allergy & Precautions, NPO status , Patient's Chart, lab work & pertinent test results, reviewed documented beta blocker date and time   History of Anesthesia Complications (+) DIFFICULT AIRWAY and history of anesthetic complications  Airway Mallampati: III  TM Distance: >3 FB Neck ROM: Full    Dental  (+) Teeth Intact, Dental Advisory Given   Pulmonary neg pulmonary ROS,    Pulmonary exam normal breath sounds clear to auscultation       Cardiovascular hypertension, Pt. on medications and Pt. on home beta blockers (-) angina+ DVT  (-) CAD, (-) Past MI and (-) Cardiac Stents Normal cardiovascular exam Rhythm:Regular Rate:Normal     Neuro/Psych  Headaches, PSYCHIATRIC DISORDERS Depression S/p SCC TIACVA, No Residual Symptoms    GI/Hepatic Neg liver ROS, hiatal hernia, GERD  Medicated and Controlled,  Endo/Other  Hypothyroidism   Renal/GU negative Renal ROS     Musculoskeletal negative musculoskeletal ROS (+)   Abdominal   Peds  Hematology  (+) Blood dyscrasia (Plavix), ,   Anesthesia Other Findings Day of surgery medications reviewed with the patient.  Reproductive/Obstetrics                           Anesthesia Physical Anesthesia Plan  ASA: III  Anesthesia Plan: General   Post-op Pain Management:    Induction: Intravenous  PONV Risk Score and Plan: 3 and Ondansetron, Dexamethasone and Treatment may vary due to age or medical condition  Airway Management Planned: Oral ETT and Video Laryngoscope Planned  Additional Equipment:   Intra-op Plan:   Post-operative Plan: Extubation in OR  Informed Consent: I have reviewed the patients History and Physical, chart, labs and discussed the procedure including the risks, benefits and alternatives for the proposed anesthesia with the patient  or authorized representative who has indicated his/her understanding and acceptance.     Dental advisory given  Plan Discussed with: CRNA  Anesthesia Plan Comments: (See PAT note written 02/17/2019 by Myra Gianotti, PA-C. She received a Difficult Intubation letter after her 06/2008 surgery. [facility] contacted twice on 02/17/19 for anesthesia records, but records received did not contain the anesthesia record.  )      Anesthesia Quick Evaluation

## 2019-02-18 ENCOUNTER — Encounter (HOSPITAL_COMMUNITY)
Admission: RE | Disposition: A | Discharge status: Home / Self Care | Payer: Self-pay | Source: Home / Self Care | Attending: Neurosurgery

## 2019-02-18 ENCOUNTER — Ambulatory Visit (HOSPITAL_COMMUNITY): Payer: Medicare Other | Admitting: Anesthesiology

## 2019-02-18 ENCOUNTER — Ambulatory Visit (HOSPITAL_COMMUNITY)
Admission: RE | Admit: 2019-02-18 | Discharge: 2019-02-18 | Disposition: A | Discharge status: Home / Self Care | Payer: Medicare Other | Attending: Neurosurgery | Admitting: Neurosurgery

## 2019-02-18 ENCOUNTER — Ambulatory Visit (HOSPITAL_COMMUNITY): Payer: Medicare Other | Admitting: Physician Assistant

## 2019-02-18 ENCOUNTER — Other Ambulatory Visit: Payer: Self-pay

## 2019-02-18 ENCOUNTER — Encounter (HOSPITAL_COMMUNITY): Payer: Self-pay

## 2019-02-18 DIAGNOSIS — M5416 Radiculopathy, lumbar region: Secondary | ICD-10-CM | POA: Insufficient documentation

## 2019-02-18 DIAGNOSIS — Z86718 Personal history of other venous thrombosis and embolism: Secondary | ICD-10-CM | POA: Diagnosis not present

## 2019-02-18 DIAGNOSIS — K219 Gastro-esophageal reflux disease without esophagitis: Secondary | ICD-10-CM | POA: Insufficient documentation

## 2019-02-18 DIAGNOSIS — Z7902 Long term (current) use of antithrombotics/antiplatelets: Secondary | ICD-10-CM | POA: Diagnosis not present

## 2019-02-18 DIAGNOSIS — Z79890 Hormone replacement therapy (postmenopausal): Secondary | ICD-10-CM | POA: Diagnosis not present

## 2019-02-18 DIAGNOSIS — M545 Low back pain: Secondary | ICD-10-CM | POA: Diagnosis not present

## 2019-02-18 DIAGNOSIS — E039 Hypothyroidism, unspecified: Secondary | ICD-10-CM | POA: Insufficient documentation

## 2019-02-18 DIAGNOSIS — I10 Essential (primary) hypertension: Secondary | ICD-10-CM | POA: Diagnosis not present

## 2019-02-18 DIAGNOSIS — Z4542 Encounter for adjustment and management of neuropacemaker (brain) (peripheral nerve) (spinal cord): Secondary | ICD-10-CM | POA: Insufficient documentation

## 2019-02-18 DIAGNOSIS — Z79899 Other long term (current) drug therapy: Secondary | ICD-10-CM | POA: Insufficient documentation

## 2019-02-18 DIAGNOSIS — M542 Cervicalgia: Secondary | ICD-10-CM | POA: Diagnosis not present

## 2019-02-18 HISTORY — PX: SPINAL CORD STIMULATOR BATTERY EXCHANGE: SHX6202

## 2019-02-18 SURGERY — SPINAL CORD STIMULATOR BATTERY EXCHANGE
Anesthesia: Monitor Anesthesia Care

## 2019-02-18 MED ORDER — FENTANYL CITRATE (PF) 250 MCG/5ML IJ SOLN
INTRAMUSCULAR | Status: AC
  Filled 2019-02-18: qty 5

## 2019-02-18 MED ORDER — TRAMADOL HCL 50 MG PO TABS: 50.0000 mg | ORAL_TABLET | Freq: Four times a day (QID) | ORAL | 0 refills | Status: AC | PRN

## 2019-02-18 MED ORDER — LIDOCAINE 2% (20 MG/ML) 5 ML SYRINGE
INTRAMUSCULAR | Status: DC | PRN
  Administered 2019-02-18: 20 mg via INTRAVENOUS

## 2019-02-18 MED ORDER — THROMBIN 5000 UNITS EX SOLR
CUTANEOUS | Status: AC
  Filled 2019-02-18: qty 5000

## 2019-02-18 MED ORDER — FENTANYL CITRATE (PF) 250 MCG/5ML IJ SOLN
INTRAMUSCULAR | Status: DC | PRN
  Administered 2019-02-18 (×2): 50 ug via INTRAVENOUS

## 2019-02-18 MED ORDER — CHLORHEXIDINE GLUCONATE CLOTH 2 % EX PADS: 6.0000 | MEDICATED_PAD | Freq: Once | CUTANEOUS | Status: DC

## 2019-02-18 MED ORDER — MIDAZOLAM HCL 5 MG/5ML IJ SOLN
INTRAMUSCULAR | Status: DC | PRN
  Administered 2019-02-18 (×2): 1 mg via INTRAVENOUS

## 2019-02-18 MED ORDER — ACETAMINOPHEN 500 MG PO TABS
ORAL_TABLET | ORAL | Status: AC
  Administered 2019-02-18: 1000 mg via ORAL
  Filled 2019-02-18: qty 2

## 2019-02-18 MED ORDER — BUPIVACAINE HCL (PF) 0.5 % IJ SOLN
INTRAMUSCULAR | Status: AC
  Filled 2019-02-18: qty 30

## 2019-02-18 MED ORDER — 0.9 % SODIUM CHLORIDE (POUR BTL) OPTIME
TOPICAL | Status: DC | PRN
  Administered 2019-02-18: 1000 mL

## 2019-02-18 MED ORDER — CEFAZOLIN SODIUM-DEXTROSE 2-4 GM/100ML-% IV SOLN
2.0000 g | INTRAVENOUS | Status: AC
  Administered 2019-02-18: 08:00:00 2 g via INTRAVENOUS

## 2019-02-18 MED ORDER — PROPOFOL 10 MG/ML IV BOLUS
INTRAVENOUS | Status: AC
  Filled 2019-02-18: qty 20

## 2019-02-18 MED ORDER — MIDAZOLAM HCL 2 MG/2ML IJ SOLN
INTRAMUSCULAR | Status: AC
  Filled 2019-02-18: qty 2

## 2019-02-18 MED ORDER — LACTATED RINGERS IV SOLN
INTRAVENOUS | Status: DC | PRN
  Administered 2019-02-18: 08:00:00 via INTRAVENOUS

## 2019-02-18 MED ORDER — THROMBIN 5000 UNITS EX SOLR
OROMUCOSAL | Status: DC | PRN
  Administered 2019-02-18: 5 mL via TOPICAL

## 2019-02-18 MED ORDER — LIDOCAINE-EPINEPHRINE 1 %-1:100000 IJ SOLN
INTRAMUSCULAR | Status: DC | PRN
  Administered 2019-02-18: 10 mL

## 2019-02-18 MED ORDER — BUPIVACAINE HCL (PF) 0.5 % IJ SOLN
INTRAMUSCULAR | Status: DC | PRN
  Administered 2019-02-18: 10 mL

## 2019-02-18 MED ORDER — CEFAZOLIN SODIUM-DEXTROSE 2-4 GM/100ML-% IV SOLN
INTRAVENOUS | Status: AC
  Filled 2019-02-18: qty 100

## 2019-02-18 MED ORDER — LIDOCAINE-EPINEPHRINE 1 %-1:100000 IJ SOLN
INTRAMUSCULAR | Status: AC
  Filled 2019-02-18: qty 1

## 2019-02-18 MED ORDER — PROPOFOL 500 MG/50ML IV EMUL
INTRAVENOUS | Status: DC | PRN
  Administered 2019-02-18: 50 ug/kg/min via INTRAVENOUS

## 2019-02-18 MED ORDER — ACETAMINOPHEN 500 MG PO TABS
1000.0000 mg | ORAL_TABLET | Freq: Once | ORAL | Status: AC
  Administered 2019-02-18: 1000 mg via ORAL

## 2019-02-18 SURGICAL SUPPLY — 56 items
BLADE CLIPPER SURG (BLADE) IMPLANT
BUR MATCHSTICK NEURO 3.0 LAGG (BURR) IMPLANT
BUR PRECISION FLUTE 5.0 (BURR) IMPLANT
CARTRIDGE OIL MAESTRO DRILL (MISCELLANEOUS) IMPLANT
COVER WAND RF STERILE (DRAPES) IMPLANT
DECANTER SPIKE VIAL GLASS SM (MISCELLANEOUS) ×3 IMPLANT
DERMABOND ADVANCED (GAUZE/BANDAGES/DRESSINGS) ×2
DERMABOND ADVANCED .7 DNX12 (GAUZE/BANDAGES/DRESSINGS) ×1 IMPLANT
DEVICE IMPLANT NEUROSTIMINATOR (Neuro Prosthesis/Implant) ×1 IMPLANT
DEVICE IMPLANT NEUROSTIMULATOR (Neuro Prosthesis/Implant) ×2 IMPLANT
DIFFUSER DRILL AIR PNEUMATIC (MISCELLANEOUS) IMPLANT
DRAPE C-ARM 42X72 X-RAY (DRAPES) IMPLANT
DRAPE INCISE IOBAN 85X60 (DRAPES) IMPLANT
DRAPE LAPAROTOMY 100X72X124 (DRAPES) ×3 IMPLANT
DRAPE POUCH INSTRU U-SHP 10X18 (DRAPES) IMPLANT
DRAPE SURG 17X23 STRL (DRAPES) ×3 IMPLANT
DRSG OPSITE POSTOP 4X6 (GAUZE/BANDAGES/DRESSINGS) ×3 IMPLANT
ELECT REM PT RETURN 9FT ADLT (ELECTROSURGICAL) ×3
ELECTRODE REM PT RTRN 9FT ADLT (ELECTROSURGICAL) ×1 IMPLANT
GAUZE 4X4 16PLY RFD (DISPOSABLE) IMPLANT
GLOVE BIO SURGEON STRL SZ8 (GLOVE) ×3 IMPLANT
GLOVE BIOGEL PI IND STRL 7.0 (GLOVE) ×1 IMPLANT
GLOVE BIOGEL PI IND STRL 8 (GLOVE) ×1 IMPLANT
GLOVE BIOGEL PI IND STRL 8.5 (GLOVE) ×1 IMPLANT
GLOVE BIOGEL PI INDICATOR 7.0 (GLOVE) ×2
GLOVE BIOGEL PI INDICATOR 8 (GLOVE) ×2
GLOVE BIOGEL PI INDICATOR 8.5 (GLOVE) ×2
GLOVE ECLIPSE 8.0 STRL XLNG CF (GLOVE) ×3 IMPLANT
GLOVE EXAM NITRILE XL STR (GLOVE) IMPLANT
GOWN STRL REUS W/ TWL LRG LVL3 (GOWN DISPOSABLE) IMPLANT
GOWN STRL REUS W/ TWL XL LVL3 (GOWN DISPOSABLE) IMPLANT
GOWN STRL REUS W/TWL 2XL LVL3 (GOWN DISPOSABLE) IMPLANT
GOWN STRL REUS W/TWL LRG LVL3 (GOWN DISPOSABLE)
GOWN STRL REUS W/TWL XL LVL3 (GOWN DISPOSABLE)
HEMOSTAT POWDER KIT SURGIFOAM (HEMOSTASIS) ×3 IMPLANT
KIT BASIN OR (CUSTOM PROCEDURE TRAY) ×3 IMPLANT
KIT TURNOVER KIT B (KITS) ×3 IMPLANT
NEEDLE HYPO 25X1 1.5 SAFETY (NEEDLE) ×3 IMPLANT
NS IRRIG 1000ML POUR BTL (IV SOLUTION) ×3 IMPLANT
OIL CARTRIDGE MAESTRO DRILL (MISCELLANEOUS)
PACK LAMINECTOMY NEURO (CUSTOM PROCEDURE TRAY) ×3 IMPLANT
RECHARGER INTELLIS (NEUROSURGERY SUPPLIES) ×3 IMPLANT
REPROGRAMMER INTELLIS (NEUROSURGERY SUPPLIES) ×3 IMPLANT
SPONGE LAP 4X18 RFD (DISPOSABLE) IMPLANT
SPONGE SURGIFOAM ABS GEL SZ50 (HEMOSTASIS) ×3 IMPLANT
STAPLER SKIN PROX WIDE 3.9 (STAPLE) ×3 IMPLANT
SUT SILK 0 TIES 10X30 (SUTURE) IMPLANT
SUT SILK 2 0 PERMA HAND 18 BK (SUTURE) ×9 IMPLANT
SUT SILK 2 0 TIES 10X30 (SUTURE) ×3 IMPLANT
SUT VIC AB 0 CT1 18XCR BRD8 (SUTURE) ×1 IMPLANT
SUT VIC AB 0 CT1 8-18 (SUTURE) ×2
SUT VIC AB 2-0 CP2 18 (SUTURE) ×3 IMPLANT
SUT VIC AB 3-0 SH 8-18 (SUTURE) ×3 IMPLANT
TOWEL GREEN STERILE (TOWEL DISPOSABLE) ×3 IMPLANT
TOWEL GREEN STERILE FF (TOWEL DISPOSABLE) ×3 IMPLANT
WATER STERILE IRR 1000ML POUR (IV SOLUTION) ×3 IMPLANT

## 2019-02-18 NOTE — Transfer of Care (Signed)
Immediate Anesthesia Transfer of Care Note  Patient: [name] Brooks  Procedure(s) Performed: Change implantable pulse generator battery (N/A )  Patient Location: PACU  Anesthesia Type:MAC  Level of Consciousness: awake, alert  and oriented  Airway & Oxygen Therapy: Patient Spontanous Breathing and Patient connected to face mask oxygen  Post-op Assessment: Report given to RN, Post -op Vital signs reviewed and stable and Patient moving all extremities X 4  Post vital signs: Reviewed and stable  Last Vitals:  Vitals Value Taken Time  BP 161/76 02/18/19 0833  Temp    Pulse 60 02/18/19 0834  Resp 19 02/18/19 0834  SpO2 99 % 02/18/19 0834  Vitals shown include unvalidated device data.  Last Pain:  Vitals:   02/18/19 0603  TempSrc:   PainSc: 0-No pain         Complications: No apparent anesthesia complications

## 2019-02-18 NOTE — Interval H&P Note (Signed)
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  The various methods of treatment have been discussed with the patient and family. After consideration of risks, benefits and other options for treatment, the patient has consented to  Procedure(s) with comments: Change implantable pulse generator battery with possible lead revision (N/A) - Change implantable pulse generator battery with possible lead revision as a surgical intervention.  The patient's history has been reviewed, patient examined, no change in status, stable for surgery.  I have reviewed the patient's chart and labs.  Questions were answered to the patient's satisfaction.     [name] Brooks

## 2019-02-18 NOTE — Progress Notes (Signed)
Stim rep at bedside adjusting settings on stimulator along with pt

## 2019-02-18 NOTE — Progress Notes (Signed)
Infiltrated iv from pta pacu removed from r hand by crna/val .

## 2019-02-18 NOTE — Brief Op Note (Signed)
02/18/2019  8:33 AM  PATIENT:  [name] Brooks  73 y.o. 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  It was elected for patient to undergo IPG revision.  PROCEDURE: Patient was brought to the operating room and given intravenous sedation.  Left mid back was prepped with betadine scrub and Duraprep.  Area of planned incision was infiltrated with lidocaine.  Prior incision was reopened and the old IPG was externalized. New IPG was connected and was placed in the pocket.  Wound was irrigated with saline. Then irrigated once more.  Incision was closed with 2-0 Vicryl and 3-0 vicryl sutures and dressed with a sterile occlusive dressing.  Counts were correct at the end of the case.  PLAN OF CARE: Discharge to home after PACU  PATIENT DISPOSITION:  PACU - hemodynamically stable.   Delay start of Pharmacological VTE agent (>24hrs) due to surgical blood loss or risk of bleeding: yes

## 2019-02-18 NOTE — Op Note (Signed)
02/18/2019  8:33 AM  PATIENT:  [name] Brooks  73 y.o. 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  It was elected for patient to undergo IPG revision.  PROCEDURE: Patient was brought to the operating room and given intravenous sedation.  Left mid back was prepped with betadine scrub and Duraprep.  Area of planned incision was infiltrated with lidocaine.  Prior incision was reopened and the old IPG was externalized. New IPG was connected and was placed in the pocket.  Wound was irrigated with saline. Then irrigated once more.  Incision was closed with 2-0 Vicryl and 3-0 vicryl sutures and dressed with a sterile occlusive dressing.  Counts were correct at the end of the case.  PLAN OF CARE: Discharge to home after PACU  PATIENT DISPOSITION:  PACU - hemodynamically stable.   Delay start of Pharmacological VTE agent (>24hrs) due to surgical blood loss or risk of bleeding: yes

## 2019-02-19 ENCOUNTER — Encounter (HOSPITAL_COMMUNITY): Payer: Self-pay | Admitting: Neurosurgery

## 2019-02-19 NOTE — Anesthesia Postprocedure Evaluation (Signed)
Anesthesia Post Note  Patient: [name] Brooks  Procedure(s) Performed: Change implantable pulse generator battery (N/A )     Patient location during evaluation: PACU Anesthesia Type: MAC Level of consciousness: awake and alert Pain management: pain level controlled Vital Signs Assessment: post-procedure vital signs reviewed and stable Respiratory status: spontaneous breathing, nonlabored ventilation, respiratory function stable and patient connected to nasal cannula oxygen Cardiovascular status: stable and blood pressure returned to baseline Postop Assessment: no apparent nausea or vomiting Anesthetic complications: no    Last Vitals:  Vitals:   02/18/19 0534 02/18/19 0839  BP: (!) 174/77 (!) 161/76  Pulse: 64   Resp: 18 (!) 22  Temp: 36.6 C 36.6 C  SpO2: 99%     Last Pain:  Vitals:   02/18/19 0839  TempSrc:   PainSc: 0-No pain                 Catalina Gravel

## 2019-02-24 DIAGNOSIS — L239 Allergic contact dermatitis, unspecified cause: Secondary | ICD-10-CM | POA: Diagnosis not present

## 2019-06-10 DIAGNOSIS — H8113 Benign paroxysmal vertigo, bilateral: Secondary | ICD-10-CM | POA: Diagnosis not present

## 2019-06-10 DIAGNOSIS — L719 Rosacea, unspecified: Secondary | ICD-10-CM | POA: Diagnosis not present

## 2019-06-12 DIAGNOSIS — M546 Pain in thoracic spine: Secondary | ICD-10-CM | POA: Diagnosis not present

## 2019-06-12 DIAGNOSIS — Z79899 Other long term (current) drug therapy: Secondary | ICD-10-CM | POA: Diagnosis not present

## 2019-06-12 DIAGNOSIS — M549 Dorsalgia, unspecified: Secondary | ICD-10-CM | POA: Diagnosis not present

## 2019-06-12 DIAGNOSIS — Z7902 Long term (current) use of antithrombotics/antiplatelets: Secondary | ICD-10-CM | POA: Diagnosis not present

## 2019-06-12 DIAGNOSIS — G8314 Monoplegia of lower limb affecting left nondominant side: Secondary | ICD-10-CM | POA: Diagnosis not present

## 2019-06-12 DIAGNOSIS — R29703 NIHSS score 3: Secondary | ICD-10-CM | POA: Diagnosis not present

## 2019-06-12 DIAGNOSIS — Z79891 Long term (current) use of opiate analgesic: Secondary | ICD-10-CM | POA: Diagnosis not present

## 2019-06-12 DIAGNOSIS — Z86718 Personal history of other venous thrombosis and embolism: Secondary | ICD-10-CM | POA: Diagnosis not present

## 2019-06-12 DIAGNOSIS — R200 Anesthesia of skin: Secondary | ICD-10-CM

## 2019-06-12 DIAGNOSIS — R918 Other nonspecific abnormal finding of lung field: Secondary | ICD-10-CM | POA: Diagnosis not present

## 2019-06-12 DIAGNOSIS — R42 Dizziness and giddiness: Secondary | ICD-10-CM

## 2019-06-12 DIAGNOSIS — I10 Essential (primary) hypertension: Secondary | ICD-10-CM | POA: Diagnosis not present

## 2019-06-12 DIAGNOSIS — E039 Hypothyroidism, unspecified: Secondary | ICD-10-CM | POA: Diagnosis not present

## 2019-06-12 DIAGNOSIS — R296 Repeated falls: Secondary | ICD-10-CM | POA: Diagnosis not present

## 2019-06-12 DIAGNOSIS — M545 Low back pain: Secondary | ICD-10-CM | POA: Diagnosis not present

## 2019-06-12 DIAGNOSIS — E876 Hypokalemia: Secondary | ICD-10-CM | POA: Diagnosis not present

## 2019-06-12 DIAGNOSIS — G8929 Other chronic pain: Secondary | ICD-10-CM | POA: Diagnosis not present

## 2019-06-12 DIAGNOSIS — Z8673 Personal history of transient ischemic attack (TIA), and cerebral infarction without residual deficits: Secondary | ICD-10-CM | POA: Diagnosis not present

## 2019-06-12 DIAGNOSIS — R270 Ataxia, unspecified: Secondary | ICD-10-CM

## 2019-06-12 DIAGNOSIS — Z9682 Presence of neurostimulator: Secondary | ICD-10-CM | POA: Diagnosis not present

## 2019-06-13 DIAGNOSIS — R200 Anesthesia of skin: Secondary | ICD-10-CM | POA: Diagnosis not present

## 2019-06-13 DIAGNOSIS — R42 Dizziness and giddiness: Secondary | ICD-10-CM | POA: Diagnosis not present

## 2019-06-13 DIAGNOSIS — R270 Ataxia, unspecified: Secondary | ICD-10-CM | POA: Diagnosis not present

## 2019-06-16 DIAGNOSIS — R7301 Impaired fasting glucose: Secondary | ICD-10-CM | POA: Diagnosis not present

## 2019-06-16 DIAGNOSIS — D509 Iron deficiency anemia, unspecified: Secondary | ICD-10-CM | POA: Diagnosis not present

## 2019-06-16 DIAGNOSIS — I10 Essential (primary) hypertension: Secondary | ICD-10-CM | POA: Diagnosis not present

## 2019-06-16 DIAGNOSIS — E785 Hyperlipidemia, unspecified: Secondary | ICD-10-CM | POA: Diagnosis not present

## 2019-06-16 DIAGNOSIS — E039 Hypothyroidism, unspecified: Secondary | ICD-10-CM | POA: Diagnosis not present

## 2019-06-20 DIAGNOSIS — K589 Irritable bowel syndrome without diarrhea: Secondary | ICD-10-CM | POA: Diagnosis not present

## 2019-06-20 DIAGNOSIS — Z9181 History of falling: Secondary | ICD-10-CM | POA: Diagnosis not present

## 2019-06-20 DIAGNOSIS — E538 Deficiency of other specified B group vitamins: Secondary | ICD-10-CM | POA: Diagnosis not present

## 2019-06-20 DIAGNOSIS — R209 Unspecified disturbances of skin sensation: Secondary | ICD-10-CM | POA: Diagnosis not present

## 2019-06-20 DIAGNOSIS — K219 Gastro-esophageal reflux disease without esophagitis: Secondary | ICD-10-CM | POA: Diagnosis not present

## 2019-06-26 DIAGNOSIS — Z0000 Encounter for general adult medical examination without abnormal findings: Secondary | ICD-10-CM | POA: Diagnosis not present

## 2019-06-26 DIAGNOSIS — Z1231 Encounter for screening mammogram for malignant neoplasm of breast: Secondary | ICD-10-CM | POA: Diagnosis not present

## 2019-06-26 DIAGNOSIS — E785 Hyperlipidemia, unspecified: Secondary | ICD-10-CM | POA: Diagnosis not present

## 2019-06-26 DIAGNOSIS — N959 Unspecified menopausal and perimenopausal disorder: Secondary | ICD-10-CM | POA: Diagnosis not present

## 2019-06-26 DIAGNOSIS — Z9181 History of falling: Secondary | ICD-10-CM | POA: Diagnosis not present

## 2019-07-25 DIAGNOSIS — R6889 Other general symptoms and signs: Secondary | ICD-10-CM | POA: Diagnosis not present

## 2019-10-03 DIAGNOSIS — R6889 Other general symptoms and signs: Secondary | ICD-10-CM | POA: Diagnosis not present

## 2019-11-14 DIAGNOSIS — R42 Dizziness and giddiness: Secondary | ICD-10-CM | POA: Diagnosis not present

## 2019-11-14 DIAGNOSIS — G4700 Insomnia, unspecified: Secondary | ICD-10-CM | POA: Diagnosis not present

## 2019-11-14 DIAGNOSIS — I10 Essential (primary) hypertension: Secondary | ICD-10-CM | POA: Diagnosis not present

## 2019-11-14 DIAGNOSIS — R4020 Unspecified coma: Secondary | ICD-10-CM | POA: Diagnosis not present

## 2019-11-14 DIAGNOSIS — H8309 Labyrinthitis, unspecified ear: Secondary | ICD-10-CM | POA: Diagnosis not present

## 2019-11-14 DIAGNOSIS — Z7982 Long term (current) use of aspirin: Secondary | ICD-10-CM | POA: Diagnosis not present

## 2019-11-14 DIAGNOSIS — R262 Difficulty in walking, not elsewhere classified: Secondary | ICD-10-CM | POA: Diagnosis not present

## 2019-11-14 DIAGNOSIS — G8929 Other chronic pain: Secondary | ICD-10-CM | POA: Diagnosis not present

## 2019-11-14 DIAGNOSIS — E039 Hypothyroidism, unspecified: Secondary | ICD-10-CM | POA: Diagnosis not present

## 2019-11-14 DIAGNOSIS — Z9181 History of falling: Secondary | ICD-10-CM | POA: Diagnosis not present

## 2019-11-14 DIAGNOSIS — Z743 Need for continuous supervision: Secondary | ICD-10-CM | POA: Diagnosis not present

## 2019-11-14 DIAGNOSIS — Z8673 Personal history of transient ischemic attack (TIA), and cerebral infarction without residual deficits: Secondary | ICD-10-CM | POA: Diagnosis not present

## 2019-11-14 DIAGNOSIS — Z7902 Long term (current) use of antithrombotics/antiplatelets: Secondary | ICD-10-CM | POA: Diagnosis not present

## 2019-11-14 DIAGNOSIS — R5381 Other malaise: Secondary | ICD-10-CM | POA: Diagnosis not present

## 2019-11-14 DIAGNOSIS — R270 Ataxia, unspecified: Secondary | ICD-10-CM | POA: Diagnosis not present

## 2019-11-14 DIAGNOSIS — E785 Hyperlipidemia, unspecified: Secondary | ICD-10-CM | POA: Diagnosis not present

## 2019-11-14 DIAGNOSIS — Z902 Acquired absence of lung [part of]: Secondary | ICD-10-CM | POA: Diagnosis not present

## 2019-11-14 DIAGNOSIS — Z9689 Presence of other specified functional implants: Secondary | ICD-10-CM | POA: Diagnosis not present

## 2019-11-14 DIAGNOSIS — Z79899 Other long term (current) drug therapy: Secondary | ICD-10-CM | POA: Diagnosis not present

## 2019-11-15 DIAGNOSIS — H8309 Labyrinthitis, unspecified ear: Secondary | ICD-10-CM | POA: Diagnosis not present

## 2019-11-15 DIAGNOSIS — I10 Essential (primary) hypertension: Secondary | ICD-10-CM | POA: Diagnosis not present

## 2019-11-15 DIAGNOSIS — R270 Ataxia, unspecified: Secondary | ICD-10-CM | POA: Diagnosis not present

## 2019-11-15 DIAGNOSIS — R42 Dizziness and giddiness: Secondary | ICD-10-CM | POA: Diagnosis not present

## 2019-11-16 DIAGNOSIS — H8309 Labyrinthitis, unspecified ear: Secondary | ICD-10-CM | POA: Diagnosis not present

## 2019-11-16 DIAGNOSIS — R270 Ataxia, unspecified: Secondary | ICD-10-CM | POA: Diagnosis not present

## 2019-11-16 DIAGNOSIS — I10 Essential (primary) hypertension: Secondary | ICD-10-CM | POA: Diagnosis not present

## 2019-11-16 DIAGNOSIS — R42 Dizziness and giddiness: Secondary | ICD-10-CM | POA: Diagnosis not present

## 2019-11-17 DIAGNOSIS — H8309 Labyrinthitis, unspecified ear: Secondary | ICD-10-CM | POA: Diagnosis not present

## 2019-11-17 DIAGNOSIS — I10 Essential (primary) hypertension: Secondary | ICD-10-CM | POA: Diagnosis not present

## 2019-11-17 DIAGNOSIS — R270 Ataxia, unspecified: Secondary | ICD-10-CM | POA: Diagnosis not present

## 2019-11-17 DIAGNOSIS — R42 Dizziness and giddiness: Secondary | ICD-10-CM | POA: Diagnosis not present

## 2019-11-25 DIAGNOSIS — R55 Syncope and collapse: Secondary | ICD-10-CM | POA: Diagnosis not present

## 2019-11-25 DIAGNOSIS — R42 Dizziness and giddiness: Secondary | ICD-10-CM | POA: Diagnosis not present

## 2019-11-25 DIAGNOSIS — H8309 Labyrinthitis, unspecified ear: Secondary | ICD-10-CM | POA: Diagnosis not present

## 2019-11-25 DIAGNOSIS — I10 Essential (primary) hypertension: Secondary | ICD-10-CM | POA: Diagnosis not present

## 2019-11-25 DIAGNOSIS — R270 Ataxia, unspecified: Secondary | ICD-10-CM | POA: Diagnosis not present

## 2019-12-26 DIAGNOSIS — I10 Essential (primary) hypertension: Secondary | ICD-10-CM | POA: Diagnosis not present

## 2019-12-26 DIAGNOSIS — D509 Iron deficiency anemia, unspecified: Secondary | ICD-10-CM | POA: Diagnosis not present

## 2019-12-26 DIAGNOSIS — E785 Hyperlipidemia, unspecified: Secondary | ICD-10-CM | POA: Diagnosis not present

## 2020-06-08 ENCOUNTER — Telehealth: Payer: Self-pay | Admitting: Gastroenterology

## 2020-06-08 NOTE — Telephone Encounter (Signed)
Patient wants to transfer her care to Dr. Lyndel Safe.  Will have her records faxed over from Dr. Collene Mares.  Needs to be seen for swallowing issues.

## 2020-06-14 DIAGNOSIS — H2513 Age-related nuclear cataract, bilateral: Secondary | ICD-10-CM | POA: Diagnosis not present

## 2020-06-16 DIAGNOSIS — K559 Vascular disorder of intestine, unspecified: Secondary | ICD-10-CM | POA: Diagnosis not present

## 2020-06-16 DIAGNOSIS — K219 Gastro-esophageal reflux disease without esophagitis: Secondary | ICD-10-CM | POA: Diagnosis not present

## 2020-06-16 DIAGNOSIS — G8929 Other chronic pain: Secondary | ICD-10-CM | POA: Diagnosis not present

## 2020-06-16 DIAGNOSIS — F32A Depression, unspecified: Secondary | ICD-10-CM | POA: Diagnosis not present

## 2020-06-16 DIAGNOSIS — R270 Ataxia, unspecified: Secondary | ICD-10-CM | POA: Diagnosis not present

## 2020-06-16 DIAGNOSIS — A419 Sepsis, unspecified organism: Secondary | ICD-10-CM | POA: Diagnosis not present

## 2020-06-16 DIAGNOSIS — M549 Dorsalgia, unspecified: Secondary | ICD-10-CM | POA: Diagnosis not present

## 2020-06-16 DIAGNOSIS — K625 Hemorrhage of anus and rectum: Secondary | ICD-10-CM | POA: Diagnosis not present

## 2020-06-16 DIAGNOSIS — Z8673 Personal history of transient ischemic attack (TIA), and cerebral infarction without residual deficits: Secondary | ICD-10-CM | POA: Diagnosis not present

## 2020-06-16 DIAGNOSIS — K5791 Diverticulosis of intestine, part unspecified, without perforation or abscess with bleeding: Secondary | ICD-10-CM | POA: Diagnosis not present

## 2020-06-16 DIAGNOSIS — K760 Fatty (change of) liver, not elsewhere classified: Secondary | ICD-10-CM | POA: Diagnosis not present

## 2020-06-16 DIAGNOSIS — J90 Pleural effusion, not elsewhere classified: Secondary | ICD-10-CM | POA: Diagnosis not present

## 2020-06-16 DIAGNOSIS — R1110 Vomiting, unspecified: Secondary | ICD-10-CM | POA: Diagnosis not present

## 2020-06-16 DIAGNOSIS — E785 Hyperlipidemia, unspecified: Secondary | ICD-10-CM | POA: Diagnosis not present

## 2020-06-16 DIAGNOSIS — I10 Essential (primary) hypertension: Secondary | ICD-10-CM | POA: Diagnosis not present

## 2020-06-16 DIAGNOSIS — E876 Hypokalemia: Secondary | ICD-10-CM | POA: Diagnosis not present

## 2020-06-16 DIAGNOSIS — Z79899 Other long term (current) drug therapy: Secondary | ICD-10-CM | POA: Diagnosis not present

## 2020-06-16 DIAGNOSIS — E039 Hypothyroidism, unspecified: Secondary | ICD-10-CM | POA: Diagnosis not present

## 2020-06-16 DIAGNOSIS — R109 Unspecified abdominal pain: Secondary | ICD-10-CM | POA: Diagnosis not present

## 2020-06-16 DIAGNOSIS — Z981 Arthrodesis status: Secondary | ICD-10-CM | POA: Diagnosis not present

## 2020-06-16 DIAGNOSIS — R509 Fever, unspecified: Secondary | ICD-10-CM | POA: Diagnosis not present

## 2020-06-16 DIAGNOSIS — R1030 Lower abdominal pain, unspecified: Secondary | ICD-10-CM | POA: Diagnosis not present

## 2020-06-16 DIAGNOSIS — Z9689 Presence of other specified functional implants: Secondary | ICD-10-CM | POA: Diagnosis not present

## 2020-06-16 DIAGNOSIS — R079 Chest pain, unspecified: Secondary | ICD-10-CM | POA: Diagnosis not present

## 2020-06-16 DIAGNOSIS — K529 Noninfective gastroenteritis and colitis, unspecified: Secondary | ICD-10-CM | POA: Diagnosis not present

## 2020-06-16 DIAGNOSIS — T426X5A Adverse effect of other antiepileptic and sedative-hypnotic drugs, initial encounter: Secondary | ICD-10-CM | POA: Diagnosis not present

## 2020-06-16 DIAGNOSIS — A09 Infectious gastroenteritis and colitis, unspecified: Secondary | ICD-10-CM | POA: Diagnosis not present

## 2020-06-16 DIAGNOSIS — R410 Disorientation, unspecified: Secondary | ICD-10-CM | POA: Diagnosis not present

## 2020-06-17 DIAGNOSIS — K529 Noninfective gastroenteritis and colitis, unspecified: Secondary | ICD-10-CM | POA: Diagnosis not present

## 2020-06-17 DIAGNOSIS — K559 Vascular disorder of intestine, unspecified: Secondary | ICD-10-CM | POA: Diagnosis not present

## 2020-06-17 DIAGNOSIS — K5791 Diverticulosis of intestine, part unspecified, without perforation or abscess with bleeding: Secondary | ICD-10-CM | POA: Diagnosis not present

## 2020-06-17 DIAGNOSIS — A09 Infectious gastroenteritis and colitis, unspecified: Secondary | ICD-10-CM | POA: Diagnosis not present

## 2020-06-18 DIAGNOSIS — K5791 Diverticulosis of intestine, part unspecified, without perforation or abscess with bleeding: Secondary | ICD-10-CM | POA: Diagnosis not present

## 2020-06-18 DIAGNOSIS — A09 Infectious gastroenteritis and colitis, unspecified: Secondary | ICD-10-CM | POA: Diagnosis not present

## 2020-06-18 DIAGNOSIS — K559 Vascular disorder of intestine, unspecified: Secondary | ICD-10-CM | POA: Diagnosis not present

## 2020-06-18 DIAGNOSIS — K529 Noninfective gastroenteritis and colitis, unspecified: Secondary | ICD-10-CM | POA: Diagnosis not present

## 2020-06-23 DIAGNOSIS — K529 Noninfective gastroenteritis and colitis, unspecified: Secondary | ICD-10-CM | POA: Diagnosis not present

## 2020-06-23 DIAGNOSIS — K625 Hemorrhage of anus and rectum: Secondary | ICD-10-CM | POA: Diagnosis not present

## 2020-06-23 DIAGNOSIS — I10 Essential (primary) hypertension: Secondary | ICD-10-CM | POA: Diagnosis not present

## 2020-06-23 DIAGNOSIS — K760 Fatty (change of) liver, not elsewhere classified: Secondary | ICD-10-CM | POA: Diagnosis not present

## 2020-06-23 DIAGNOSIS — R1030 Lower abdominal pain, unspecified: Secondary | ICD-10-CM | POA: Diagnosis not present

## 2020-07-14 DIAGNOSIS — Z0000 Encounter for general adult medical examination without abnormal findings: Secondary | ICD-10-CM | POA: Diagnosis not present

## 2020-07-14 DIAGNOSIS — Z9181 History of falling: Secondary | ICD-10-CM | POA: Diagnosis not present

## 2020-07-14 DIAGNOSIS — E785 Hyperlipidemia, unspecified: Secondary | ICD-10-CM | POA: Diagnosis not present

## 2020-07-14 DIAGNOSIS — Z139 Encounter for screening, unspecified: Secondary | ICD-10-CM | POA: Diagnosis not present

## 2020-11-25 DIAGNOSIS — Z1231 Encounter for screening mammogram for malignant neoplasm of breast: Secondary | ICD-10-CM | POA: Diagnosis not present

## 2020-11-25 DIAGNOSIS — M8589 Other specified disorders of bone density and structure, multiple sites: Secondary | ICD-10-CM | POA: Diagnosis not present

## 2020-11-25 DIAGNOSIS — Z780 Asymptomatic menopausal state: Secondary | ICD-10-CM | POA: Diagnosis not present

## 2021-01-14 DIAGNOSIS — M5410 Radiculopathy, site unspecified: Secondary | ICD-10-CM | POA: Diagnosis not present

## 2021-01-14 DIAGNOSIS — M79602 Pain in left arm: Secondary | ICD-10-CM | POA: Diagnosis not present

## 2021-01-14 DIAGNOSIS — M79622 Pain in left upper arm: Secondary | ICD-10-CM | POA: Diagnosis not present

## 2021-02-03 DIAGNOSIS — G8911 Acute pain due to trauma: Secondary | ICD-10-CM | POA: Diagnosis not present

## 2021-02-03 DIAGNOSIS — R110 Nausea: Secondary | ICD-10-CM | POA: Diagnosis not present

## 2021-02-03 DIAGNOSIS — M545 Low back pain, unspecified: Secondary | ICD-10-CM | POA: Diagnosis not present

## 2021-03-17 DIAGNOSIS — G250 Essential tremor: Secondary | ICD-10-CM | POA: Diagnosis not present

## 2021-03-17 DIAGNOSIS — D509 Iron deficiency anemia, unspecified: Secondary | ICD-10-CM | POA: Diagnosis not present

## 2021-03-17 DIAGNOSIS — E785 Hyperlipidemia, unspecified: Secondary | ICD-10-CM | POA: Diagnosis not present

## 2021-03-17 DIAGNOSIS — Z79899 Other long term (current) drug therapy: Secondary | ICD-10-CM | POA: Diagnosis not present

## 2021-03-17 DIAGNOSIS — E039 Hypothyroidism, unspecified: Secondary | ICD-10-CM | POA: Diagnosis not present

## 2021-03-17 DIAGNOSIS — I10 Essential (primary) hypertension: Secondary | ICD-10-CM | POA: Diagnosis not present

## 2021-04-19 DIAGNOSIS — I10 Essential (primary) hypertension: Secondary | ICD-10-CM | POA: Diagnosis not present

## 2021-04-19 DIAGNOSIS — H1131 Conjunctival hemorrhage, right eye: Secondary | ICD-10-CM | POA: Diagnosis not present

## 2021-06-12 DIAGNOSIS — Z5321 Procedure and treatment not carried out due to patient leaving prior to being seen by health care provider: Secondary | ICD-10-CM | POA: Diagnosis not present

## 2021-06-12 DIAGNOSIS — Z20822 Contact with and (suspected) exposure to covid-19: Secondary | ICD-10-CM | POA: Diagnosis not present

## 2021-06-12 DIAGNOSIS — R109 Unspecified abdominal pain: Secondary | ICD-10-CM | POA: Diagnosis not present

## 2021-06-12 DIAGNOSIS — R197 Diarrhea, unspecified: Secondary | ICD-10-CM | POA: Diagnosis not present

## 2021-07-21 DIAGNOSIS — E039 Hypothyroidism, unspecified: Secondary | ICD-10-CM | POA: Diagnosis not present

## 2021-07-21 DIAGNOSIS — I10 Essential (primary) hypertension: Secondary | ICD-10-CM | POA: Diagnosis not present

## 2021-07-21 DIAGNOSIS — E785 Hyperlipidemia, unspecified: Secondary | ICD-10-CM | POA: Diagnosis not present

## 2021-07-21 DIAGNOSIS — D509 Iron deficiency anemia, unspecified: Secondary | ICD-10-CM | POA: Diagnosis not present

## 2021-08-08 NOTE — Telephone Encounter (Signed)
Following up on this transfer of care.  Was it approved?

## 2021-08-10 NOTE — Telephone Encounter (Signed)
Routine appointment please RG

## 2021-08-14 DIAGNOSIS — M549 Dorsalgia, unspecified: Secondary | ICD-10-CM | POA: Diagnosis not present

## 2021-08-14 DIAGNOSIS — Z5321 Procedure and treatment not carried out due to patient leaving prior to being seen by health care provider: Secondary | ICD-10-CM | POA: Diagnosis not present

## 2021-08-28 DIAGNOSIS — Z20822 Contact with and (suspected) exposure to covid-19: Secondary | ICD-10-CM | POA: Diagnosis not present

## 2021-09-01 ENCOUNTER — Encounter: Payer: Self-pay | Admitting: Gastroenterology

## 2021-09-14 DIAGNOSIS — R059 Cough, unspecified: Secondary | ICD-10-CM | POA: Diagnosis not present

## 2021-09-14 DIAGNOSIS — R051 Acute cough: Secondary | ICD-10-CM | POA: Diagnosis not present

## 2021-09-14 DIAGNOSIS — R39198 Other difficulties with micturition: Secondary | ICD-10-CM | POA: Diagnosis not present

## 2021-09-14 DIAGNOSIS — Z20822 Contact with and (suspected) exposure to covid-19: Secondary | ICD-10-CM | POA: Diagnosis not present

## 2021-09-26 DIAGNOSIS — U071 COVID-19: Secondary | ICD-10-CM | POA: Diagnosis not present

## 2021-09-26 DIAGNOSIS — R531 Weakness: Secondary | ICD-10-CM | POA: Diagnosis not present

## 2021-09-26 DIAGNOSIS — J1282 Pneumonia due to coronavirus disease 2019: Secondary | ICD-10-CM | POA: Diagnosis not present

## 2021-09-27 DIAGNOSIS — J9811 Atelectasis: Secondary | ICD-10-CM | POA: Diagnosis not present

## 2021-09-27 DIAGNOSIS — U071 COVID-19: Secondary | ICD-10-CM | POA: Diagnosis not present

## 2021-09-30 ENCOUNTER — Ambulatory Visit: Payer: Medicare Other | Admitting: Gastroenterology

## 2021-10-14 DIAGNOSIS — R059 Cough, unspecified: Secondary | ICD-10-CM | POA: Diagnosis not present

## 2021-10-14 DIAGNOSIS — U071 COVID-19: Secondary | ICD-10-CM | POA: Diagnosis not present

## 2021-10-14 DIAGNOSIS — R0602 Shortness of breath: Secondary | ICD-10-CM | POA: Diagnosis not present

## 2021-10-14 DIAGNOSIS — I700 Atherosclerosis of aorta: Secondary | ICD-10-CM | POA: Diagnosis not present

## 2021-10-27 DIAGNOSIS — M25512 Pain in left shoulder: Secondary | ICD-10-CM | POA: Diagnosis not present

## 2021-11-06 DIAGNOSIS — N3001 Acute cystitis with hematuria: Secondary | ICD-10-CM | POA: Diagnosis not present

## 2021-11-06 DIAGNOSIS — R350 Frequency of micturition: Secondary | ICD-10-CM | POA: Diagnosis not present

## 2021-11-11 DIAGNOSIS — R410 Disorientation, unspecified: Secondary | ICD-10-CM | POA: Diagnosis not present

## 2021-11-11 DIAGNOSIS — N3001 Acute cystitis with hematuria: Secondary | ICD-10-CM | POA: Diagnosis not present

## 2021-11-29 DIAGNOSIS — W010XXA Fall on same level from slipping, tripping and stumbling without subsequent striking against object, initial encounter: Secondary | ICD-10-CM | POA: Diagnosis not present

## 2021-11-29 DIAGNOSIS — R221 Localized swelling, mass and lump, neck: Secondary | ICD-10-CM | POA: Diagnosis not present

## 2021-11-29 DIAGNOSIS — R296 Repeated falls: Secondary | ICD-10-CM | POA: Diagnosis not present

## 2021-12-09 DIAGNOSIS — R221 Localized swelling, mass and lump, neck: Secondary | ICD-10-CM | POA: Diagnosis not present

## 2022-01-24 DIAGNOSIS — M25512 Pain in left shoulder: Secondary | ICD-10-CM | POA: Diagnosis not present

## 2022-01-24 DIAGNOSIS — M25522 Pain in left elbow: Secondary | ICD-10-CM | POA: Diagnosis not present

## 2022-02-04 DIAGNOSIS — N390 Urinary tract infection, site not specified: Secondary | ICD-10-CM | POA: Diagnosis not present

## 2022-03-06 DIAGNOSIS — M19042 Primary osteoarthritis, left hand: Secondary | ICD-10-CM | POA: Diagnosis not present

## 2022-03-06 DIAGNOSIS — M19041 Primary osteoarthritis, right hand: Secondary | ICD-10-CM | POA: Diagnosis not present

## 2022-03-06 DIAGNOSIS — L989 Disorder of the skin and subcutaneous tissue, unspecified: Secondary | ICD-10-CM | POA: Diagnosis not present

## 2022-03-24 DIAGNOSIS — D519 Vitamin B12 deficiency anemia, unspecified: Secondary | ICD-10-CM | POA: Diagnosis not present

## 2022-03-24 DIAGNOSIS — I10 Essential (primary) hypertension: Secondary | ICD-10-CM | POA: Diagnosis not present

## 2022-03-24 DIAGNOSIS — E039 Hypothyroidism, unspecified: Secondary | ICD-10-CM | POA: Diagnosis not present

## 2022-03-24 DIAGNOSIS — G309 Alzheimer's disease, unspecified: Secondary | ICD-10-CM | POA: Diagnosis not present

## 2022-04-05 ENCOUNTER — Other Ambulatory Visit: Payer: Self-pay | Admitting: Internal Medicine

## 2022-04-05 DIAGNOSIS — F0280 Dementia in other diseases classified elsewhere without behavioral disturbance: Secondary | ICD-10-CM

## 2022-04-06 DIAGNOSIS — Z9181 History of falling: Secondary | ICD-10-CM | POA: Diagnosis not present

## 2022-04-06 DIAGNOSIS — Z0000 Encounter for general adult medical examination without abnormal findings: Secondary | ICD-10-CM | POA: Diagnosis not present

## 2022-04-06 DIAGNOSIS — E785 Hyperlipidemia, unspecified: Secondary | ICD-10-CM | POA: Diagnosis not present

## 2022-05-07 DIAGNOSIS — M79671 Pain in right foot: Secondary | ICD-10-CM | POA: Diagnosis not present

## 2022-05-07 DIAGNOSIS — M170 Bilateral primary osteoarthritis of knee: Secondary | ICD-10-CM | POA: Diagnosis not present

## 2022-05-10 DIAGNOSIS — M76821 Posterior tibial tendinitis, right leg: Secondary | ICD-10-CM | POA: Diagnosis not present

## 2022-06-05 NOTE — Telephone Encounter (Signed)
Patient schedule for 06/06/22.

## 2022-06-06 ENCOUNTER — Ambulatory Visit: Payer: Medicare Other | Admitting: Gastroenterology

## 2022-09-05 ENCOUNTER — Encounter: Payer: Self-pay | Admitting: Gastroenterology

## 2022-09-12 DIAGNOSIS — M79641 Pain in right hand: Secondary | ICD-10-CM | POA: Diagnosis not present

## 2022-09-12 DIAGNOSIS — M1811 Unilateral primary osteoarthritis of first carpometacarpal joint, right hand: Secondary | ICD-10-CM | POA: Diagnosis not present

## 2022-10-15 DIAGNOSIS — R9431 Abnormal electrocardiogram [ECG] [EKG]: Secondary | ICD-10-CM | POA: Diagnosis not present

## 2022-10-15 DIAGNOSIS — R531 Weakness: Secondary | ICD-10-CM | POA: Diagnosis not present

## 2022-10-15 DIAGNOSIS — I10 Essential (primary) hypertension: Secondary | ICD-10-CM | POA: Diagnosis not present

## 2022-10-15 DIAGNOSIS — I510 Cardiac septal defect, acquired: Secondary | ICD-10-CM | POA: Diagnosis not present

## 2022-10-15 DIAGNOSIS — R42 Dizziness and giddiness: Secondary | ICD-10-CM | POA: Diagnosis not present

## 2022-10-15 DIAGNOSIS — Z743 Need for continuous supervision: Secondary | ICD-10-CM | POA: Diagnosis not present

## 2022-10-15 DIAGNOSIS — R55 Syncope and collapse: Secondary | ICD-10-CM | POA: Diagnosis not present

## 2022-10-15 DIAGNOSIS — R300 Dysuria: Secondary | ICD-10-CM | POA: Diagnosis not present

## 2022-10-15 DIAGNOSIS — R6889 Other general symptoms and signs: Secondary | ICD-10-CM | POA: Diagnosis not present

## 2022-10-19 ENCOUNTER — Telehealth: Payer: Self-pay | Admitting: *Deleted

## 2022-10-19 NOTE — Telephone Encounter (Signed)
     Patient  visit on 10/15/2022  at [facility]  was for treatment   Have you been able to follow up with your primary care physician? Was able to get her medications she needed her bp was elevated but it is down now . She has transportation and access to her medications , will do follow up in a few weeks  The patient was  able to obtain any needed medicine or equipment.  Are there diet recommendations that you are having difficulty following?  Patient expresses understanding of discharge instructions and education provided has no other needs at this time.   [facility] [phone] [address] , [place] [address] Email : [email or web address]

## 2022-11-01 ENCOUNTER — Ambulatory Visit: Payer: Medicare Other | Admitting: Gastroenterology

## 2022-12-03 DIAGNOSIS — I161 Hypertensive emergency: Secondary | ICD-10-CM | POA: Diagnosis not present

## 2022-12-03 DIAGNOSIS — G9340 Encephalopathy, unspecified: Secondary | ICD-10-CM | POA: Diagnosis not present

## 2022-12-03 DIAGNOSIS — F32A Depression, unspecified: Secondary | ICD-10-CM | POA: Diagnosis not present

## 2022-12-03 DIAGNOSIS — Z87440 Personal history of urinary (tract) infections: Secondary | ICD-10-CM | POA: Diagnosis not present

## 2022-12-03 DIAGNOSIS — Z8673 Personal history of transient ischemic attack (TIA), and cerebral infarction without residual deficits: Secondary | ICD-10-CM | POA: Diagnosis not present

## 2022-12-03 DIAGNOSIS — K449 Diaphragmatic hernia without obstruction or gangrene: Secondary | ICD-10-CM | POA: Diagnosis not present

## 2022-12-03 DIAGNOSIS — M1990 Unspecified osteoarthritis, unspecified site: Secondary | ICD-10-CM | POA: Diagnosis not present

## 2022-12-03 DIAGNOSIS — E039 Hypothyroidism, unspecified: Secondary | ICD-10-CM | POA: Diagnosis not present

## 2022-12-03 DIAGNOSIS — G8929 Other chronic pain: Secondary | ICD-10-CM | POA: Diagnosis not present

## 2022-12-03 DIAGNOSIS — E785 Hyperlipidemia, unspecified: Secondary | ICD-10-CM | POA: Diagnosis not present

## 2022-12-03 DIAGNOSIS — Z9682 Presence of neurostimulator: Secondary | ICD-10-CM | POA: Diagnosis not present

## 2022-12-03 DIAGNOSIS — I10 Essential (primary) hypertension: Secondary | ICD-10-CM | POA: Diagnosis not present

## 2022-12-03 DIAGNOSIS — I674 Hypertensive encephalopathy: Secondary | ICD-10-CM | POA: Diagnosis not present

## 2022-12-03 DIAGNOSIS — Z79899 Other long term (current) drug therapy: Secondary | ICD-10-CM | POA: Diagnosis not present

## 2022-12-03 DIAGNOSIS — Z7902 Long term (current) use of antithrombotics/antiplatelets: Secondary | ICD-10-CM | POA: Diagnosis not present

## 2022-12-03 DIAGNOSIS — F03918 Unspecified dementia, unspecified severity, with other behavioral disturbance: Secondary | ICD-10-CM | POA: Diagnosis not present

## 2022-12-03 DIAGNOSIS — I459 Conduction disorder, unspecified: Secondary | ICD-10-CM | POA: Diagnosis not present

## 2022-12-03 DIAGNOSIS — Z882 Allergy status to sulfonamides status: Secondary | ICD-10-CM | POA: Diagnosis not present

## 2022-12-03 DIAGNOSIS — D509 Iron deficiency anemia, unspecified: Secondary | ICD-10-CM | POA: Diagnosis not present

## 2022-12-03 DIAGNOSIS — Z888 Allergy status to other drugs, medicaments and biological substances status: Secondary | ICD-10-CM | POA: Diagnosis not present

## 2022-12-03 DIAGNOSIS — R42 Dizziness and giddiness: Secondary | ICD-10-CM | POA: Diagnosis not present

## 2022-12-03 DIAGNOSIS — G4700 Insomnia, unspecified: Secondary | ICD-10-CM | POA: Diagnosis not present

## 2022-12-03 DIAGNOSIS — K219 Gastro-esophageal reflux disease without esophagitis: Secondary | ICD-10-CM | POA: Diagnosis not present

## 2022-12-03 DIAGNOSIS — Z886 Allergy status to analgesic agent status: Secondary | ICD-10-CM | POA: Diagnosis not present

## 2022-12-03 DIAGNOSIS — M549 Dorsalgia, unspecified: Secondary | ICD-10-CM | POA: Diagnosis not present

## 2022-12-03 DIAGNOSIS — I6523 Occlusion and stenosis of bilateral carotid arteries: Secondary | ICD-10-CM | POA: Diagnosis not present

## 2022-12-09 DIAGNOSIS — Z79899 Other long term (current) drug therapy: Secondary | ICD-10-CM | POA: Diagnosis not present

## 2022-12-09 DIAGNOSIS — Z981 Arthrodesis status: Secondary | ICD-10-CM | POA: Diagnosis not present

## 2022-12-09 DIAGNOSIS — Z8673 Personal history of transient ischemic attack (TIA), and cerebral infarction without residual deficits: Secondary | ICD-10-CM | POA: Diagnosis not present

## 2022-12-09 DIAGNOSIS — G8929 Other chronic pain: Secondary | ICD-10-CM | POA: Diagnosis not present

## 2022-12-09 DIAGNOSIS — R2689 Other abnormalities of gait and mobility: Secondary | ICD-10-CM | POA: Diagnosis not present

## 2022-12-09 DIAGNOSIS — R451 Restlessness and agitation: Secondary | ICD-10-CM | POA: Diagnosis not present

## 2022-12-09 DIAGNOSIS — R6889 Other general symptoms and signs: Secondary | ICD-10-CM | POA: Diagnosis not present

## 2022-12-09 DIAGNOSIS — I10 Essential (primary) hypertension: Secondary | ICD-10-CM | POA: Diagnosis not present

## 2022-12-09 DIAGNOSIS — E039 Hypothyroidism, unspecified: Secondary | ICD-10-CM | POA: Diagnosis not present

## 2022-12-09 DIAGNOSIS — Z743 Need for continuous supervision: Secondary | ICD-10-CM | POA: Diagnosis not present

## 2022-12-09 DIAGNOSIS — R9431 Abnormal electrocardiogram [ECG] [EKG]: Secondary | ICD-10-CM | POA: Diagnosis not present

## 2022-12-09 DIAGNOSIS — Z7902 Long term (current) use of antithrombotics/antiplatelets: Secondary | ICD-10-CM | POA: Diagnosis not present

## 2022-12-09 DIAGNOSIS — F03911 Unspecified dementia, unspecified severity, with agitation: Secondary | ICD-10-CM | POA: Diagnosis not present

## 2022-12-09 DIAGNOSIS — D649 Anemia, unspecified: Secondary | ICD-10-CM | POA: Diagnosis not present

## 2022-12-09 DIAGNOSIS — I4510 Unspecified right bundle-branch block: Secondary | ICD-10-CM | POA: Diagnosis not present

## 2022-12-10 DIAGNOSIS — R451 Restlessness and agitation: Secondary | ICD-10-CM | POA: Diagnosis not present

## 2022-12-11 DIAGNOSIS — R451 Restlessness and agitation: Secondary | ICD-10-CM | POA: Diagnosis not present

## 2022-12-12 DIAGNOSIS — R410 Disorientation, unspecified: Secondary | ICD-10-CM | POA: Diagnosis not present

## 2022-12-12 DIAGNOSIS — I674 Hypertensive encephalopathy: Secondary | ICD-10-CM | POA: Diagnosis not present

## 2022-12-27 DIAGNOSIS — F02811 Dementia in other diseases classified elsewhere, unspecified severity, with agitation: Secondary | ICD-10-CM | POA: Diagnosis not present

## 2022-12-27 DIAGNOSIS — G309 Alzheimer's disease, unspecified: Secondary | ICD-10-CM | POA: Diagnosis not present

## 2022-12-28 DIAGNOSIS — F02811 Dementia in other diseases classified elsewhere, unspecified severity, with agitation: Secondary | ICD-10-CM | POA: Diagnosis not present

## 2022-12-28 DIAGNOSIS — G309 Alzheimer's disease, unspecified: Secondary | ICD-10-CM | POA: Diagnosis not present

## 2023-01-01 DIAGNOSIS — E876 Hypokalemia: Secondary | ICD-10-CM | POA: Diagnosis not present

## 2023-01-03 DIAGNOSIS — W19XXXA Unspecified fall, initial encounter: Secondary | ICD-10-CM | POA: Diagnosis not present

## 2023-01-03 DIAGNOSIS — S81001A Unspecified open wound, right knee, initial encounter: Secondary | ICD-10-CM | POA: Diagnosis not present

## 2023-01-03 DIAGNOSIS — S80251A Superficial foreign body, right knee, initial encounter: Secondary | ICD-10-CM | POA: Diagnosis not present

## 2023-01-03 DIAGNOSIS — S81011A Laceration without foreign body, right knee, initial encounter: Secondary | ICD-10-CM | POA: Diagnosis not present

## 2023-01-03 DIAGNOSIS — S81021A Laceration with foreign body, right knee, initial encounter: Secondary | ICD-10-CM | POA: Diagnosis not present

## 2023-01-03 DIAGNOSIS — Z743 Need for continuous supervision: Secondary | ICD-10-CM | POA: Diagnosis not present

## 2023-01-03 DIAGNOSIS — Z23 Encounter for immunization: Secondary | ICD-10-CM | POA: Diagnosis not present

## 2023-01-03 DIAGNOSIS — M25561 Pain in right knee: Secondary | ICD-10-CM | POA: Diagnosis not present

## 2023-01-03 DIAGNOSIS — R6889 Other general symptoms and signs: Secondary | ICD-10-CM | POA: Diagnosis not present

## 2023-01-03 DIAGNOSIS — S8001XA Contusion of right knee, initial encounter: Secondary | ICD-10-CM | POA: Diagnosis not present

## 2023-01-09 DIAGNOSIS — D509 Iron deficiency anemia, unspecified: Secondary | ICD-10-CM | POA: Diagnosis not present

## 2023-01-09 DIAGNOSIS — G309 Alzheimer's disease, unspecified: Secondary | ICD-10-CM | POA: Diagnosis not present

## 2023-01-09 DIAGNOSIS — F02811 Dementia in other diseases classified elsewhere, unspecified severity, with agitation: Secondary | ICD-10-CM | POA: Diagnosis not present

## 2023-01-09 DIAGNOSIS — I10 Essential (primary) hypertension: Secondary | ICD-10-CM | POA: Diagnosis not present

## 2023-01-10 ENCOUNTER — Telehealth: Payer: Self-pay

## 2023-01-10 NOTE — Telephone Encounter (Signed)
Transition Care Management Unsuccessful Follow-up Telephone Call  Date of discharge and from where:  01/03/2023 [facility]  Attempts:  1st Attempt  Reason for unsuccessful TCM follow-up call:  Unable to leave message  [name] Brooks [facility]  [facility] Population Health Community Resource Care Guide   [email or web address]  ?? [phone]   Website: [email or web address]  [email or web address]

## 2023-01-10 NOTE — Telephone Encounter (Signed)
Transition Care Management Unsuccessful Follow-up Telephone Call  Date of discharge and from where:  01/03/2023 [facility]  Attempts:  2nd Attempt  Reason for unsuccessful TCM follow-up call:  Unable to leave message  Promyse Ardito Shar[facility]  [facility] Population Health Community Resource Care Guide   [email or web address]  ?? [phone]   Website: [email or web address]  [email or web address]

## 2023-01-23 DIAGNOSIS — R0609 Other forms of dyspnea: Secondary | ICD-10-CM | POA: Diagnosis not present

## 2023-01-23 DIAGNOSIS — G309 Alzheimer's disease, unspecified: Secondary | ICD-10-CM | POA: Diagnosis not present

## 2023-01-23 DIAGNOSIS — F02811 Dementia in other diseases classified elsewhere, unspecified severity, with agitation: Secondary | ICD-10-CM | POA: Diagnosis not present

## 2023-01-26 DIAGNOSIS — R0609 Other forms of dyspnea: Secondary | ICD-10-CM | POA: Diagnosis not present

## 2023-01-26 DIAGNOSIS — I361 Nonrheumatic tricuspid (valve) insufficiency: Secondary | ICD-10-CM | POA: Diagnosis not present

## 2023-02-06 DIAGNOSIS — G309 Alzheimer's disease, unspecified: Secondary | ICD-10-CM | POA: Diagnosis not present

## 2023-02-06 DIAGNOSIS — M25571 Pain in right ankle and joints of right foot: Secondary | ICD-10-CM | POA: Diagnosis not present

## 2023-02-06 DIAGNOSIS — F02811 Dementia in other diseases classified elsewhere, unspecified severity, with agitation: Secondary | ICD-10-CM | POA: Diagnosis not present

## 2023-02-06 DIAGNOSIS — M19071 Primary osteoarthritis, right ankle and foot: Secondary | ICD-10-CM | POA: Diagnosis not present

## 2023-02-06 DIAGNOSIS — I10 Essential (primary) hypertension: Secondary | ICD-10-CM | POA: Diagnosis not present

## 2023-02-06 DIAGNOSIS — D509 Iron deficiency anemia, unspecified: Secondary | ICD-10-CM | POA: Diagnosis not present

## 2023-02-06 DIAGNOSIS — M7731 Calcaneal spur, right foot: Secondary | ICD-10-CM | POA: Diagnosis not present

## 2023-02-06 DIAGNOSIS — M7661 Achilles tendinitis, right leg: Secondary | ICD-10-CM | POA: Diagnosis not present

## 2023-02-06 DIAGNOSIS — M2041 Other hammer toe(s) (acquired), right foot: Secondary | ICD-10-CM | POA: Diagnosis not present

## 2023-02-09 DIAGNOSIS — M47816 Spondylosis without myelopathy or radiculopathy, lumbar region: Secondary | ICD-10-CM | POA: Diagnosis not present

## 2023-02-09 DIAGNOSIS — M7751 Other enthesopathy of right foot: Secondary | ICD-10-CM | POA: Diagnosis not present

## 2023-02-09 DIAGNOSIS — M19071 Primary osteoarthritis, right ankle and foot: Secondary | ICD-10-CM | POA: Diagnosis not present

## 2023-02-09 DIAGNOSIS — M5459 Other low back pain: Secondary | ICD-10-CM | POA: Diagnosis not present

## 2023-02-09 DIAGNOSIS — I700 Atherosclerosis of aorta: Secondary | ICD-10-CM | POA: Diagnosis not present

## 2023-02-09 DIAGNOSIS — M79671 Pain in right foot: Secondary | ICD-10-CM | POA: Diagnosis not present

## 2023-02-09 DIAGNOSIS — M7989 Other specified soft tissue disorders: Secondary | ICD-10-CM | POA: Diagnosis not present

## 2023-02-09 DIAGNOSIS — M5136 Other intervertebral disc degeneration, lumbar region: Secondary | ICD-10-CM | POA: Diagnosis not present

## 2023-02-14 ENCOUNTER — Encounter: Payer: Self-pay | Admitting: Internal Medicine

## 2023-02-15 DIAGNOSIS — M25571 Pain in right ankle and joints of right foot: Secondary | ICD-10-CM | POA: Diagnosis not present

## 2023-02-21 ENCOUNTER — Encounter: Payer: Self-pay | Admitting: Gastroenterology

## 2023-02-26 DIAGNOSIS — R262 Difficulty in walking, not elsewhere classified: Secondary | ICD-10-CM | POA: Diagnosis not present

## 2023-02-26 DIAGNOSIS — I10 Essential (primary) hypertension: Secondary | ICD-10-CM | POA: Diagnosis not present

## 2023-02-26 DIAGNOSIS — G309 Alzheimer's disease, unspecified: Secondary | ICD-10-CM | POA: Diagnosis not present

## 2023-02-26 DIAGNOSIS — M25571 Pain in right ankle and joints of right foot: Secondary | ICD-10-CM | POA: Diagnosis not present

## 2023-02-26 DIAGNOSIS — D509 Iron deficiency anemia, unspecified: Secondary | ICD-10-CM | POA: Diagnosis not present

## 2023-02-26 DIAGNOSIS — F02811 Dementia in other diseases classified elsewhere, unspecified severity, with agitation: Secondary | ICD-10-CM | POA: Diagnosis not present

## 2023-02-28 DIAGNOSIS — R350 Frequency of micturition: Secondary | ICD-10-CM | POA: Diagnosis not present

## 2023-03-16 DIAGNOSIS — M799 Soft tissue disorder, unspecified: Secondary | ICD-10-CM | POA: Diagnosis not present

## 2023-03-16 DIAGNOSIS — M25571 Pain in right ankle and joints of right foot: Secondary | ICD-10-CM | POA: Diagnosis not present

## 2023-03-16 DIAGNOSIS — M7731 Calcaneal spur, right foot: Secondary | ICD-10-CM | POA: Diagnosis not present

## 2023-03-23 DIAGNOSIS — Z79899 Other long term (current) drug therapy: Secondary | ICD-10-CM | POA: Diagnosis not present

## 2023-03-28 DIAGNOSIS — M779 Enthesopathy, unspecified: Secondary | ICD-10-CM | POA: Diagnosis not present

## 2023-04-02 DIAGNOSIS — D509 Iron deficiency anemia, unspecified: Secondary | ICD-10-CM | POA: Diagnosis not present

## 2023-04-02 DIAGNOSIS — I10 Essential (primary) hypertension: Secondary | ICD-10-CM | POA: Diagnosis not present

## 2023-04-02 DIAGNOSIS — G309 Alzheimer's disease, unspecified: Secondary | ICD-10-CM | POA: Diagnosis not present

## 2023-04-02 DIAGNOSIS — R600 Localized edema: Secondary | ICD-10-CM | POA: Diagnosis not present

## 2023-04-05 DIAGNOSIS — N390 Urinary tract infection, site not specified: Secondary | ICD-10-CM | POA: Diagnosis not present

## 2023-04-09 DIAGNOSIS — R413 Other amnesia: Secondary | ICD-10-CM | POA: Diagnosis not present

## 2023-04-20 DIAGNOSIS — G309 Alzheimer's disease, unspecified: Secondary | ICD-10-CM | POA: Diagnosis not present

## 2023-04-23 ENCOUNTER — Ambulatory Visit: Payer: Medicare Other | Admitting: Gastroenterology

## 2023-04-25 DIAGNOSIS — Z23 Encounter for immunization: Secondary | ICD-10-CM | POA: Diagnosis not present

## 2023-04-25 DIAGNOSIS — I10 Essential (primary) hypertension: Secondary | ICD-10-CM | POA: Diagnosis not present

## 2023-06-05 DIAGNOSIS — R600 Localized edema: Secondary | ICD-10-CM | POA: Diagnosis not present

## 2023-06-05 DIAGNOSIS — E039 Hypothyroidism, unspecified: Secondary | ICD-10-CM | POA: Diagnosis not present

## 2023-06-05 DIAGNOSIS — I10 Essential (primary) hypertension: Secondary | ICD-10-CM | POA: Diagnosis not present

## 2023-06-05 DIAGNOSIS — G309 Alzheimer's disease, unspecified: Secondary | ICD-10-CM | POA: Diagnosis not present

## 2023-06-05 DIAGNOSIS — D509 Iron deficiency anemia, unspecified: Secondary | ICD-10-CM | POA: Diagnosis not present

## 2023-06-08 DIAGNOSIS — E876 Hypokalemia: Secondary | ICD-10-CM | POA: Diagnosis not present

## 2023-06-11 DIAGNOSIS — D509 Iron deficiency anemia, unspecified: Secondary | ICD-10-CM | POA: Diagnosis not present

## 2023-06-11 DIAGNOSIS — E039 Hypothyroidism, unspecified: Secondary | ICD-10-CM | POA: Diagnosis not present

## 2023-06-11 DIAGNOSIS — I10 Essential (primary) hypertension: Secondary | ICD-10-CM | POA: Diagnosis not present

## 2023-06-20 DIAGNOSIS — W19XXXA Unspecified fall, initial encounter: Secondary | ICD-10-CM | POA: Diagnosis not present

## 2023-06-20 DIAGNOSIS — R519 Headache, unspecified: Secondary | ICD-10-CM | POA: Diagnosis not present

## 2023-06-20 DIAGNOSIS — S40212A Abrasion of left shoulder, initial encounter: Secondary | ICD-10-CM | POA: Diagnosis not present

## 2023-06-20 DIAGNOSIS — M25562 Pain in left knee: Secondary | ICD-10-CM | POA: Diagnosis not present

## 2023-06-20 DIAGNOSIS — Z043 Encounter for examination and observation following other accident: Secondary | ICD-10-CM | POA: Diagnosis not present

## 2023-06-20 DIAGNOSIS — S8002XA Contusion of left knee, initial encounter: Secondary | ICD-10-CM | POA: Diagnosis not present

## 2023-06-20 DIAGNOSIS — S0990XA Unspecified injury of head, initial encounter: Secondary | ICD-10-CM | POA: Diagnosis not present

## 2023-06-20 DIAGNOSIS — S199XXA Unspecified injury of neck, initial encounter: Secondary | ICD-10-CM | POA: Diagnosis not present

## 2023-06-20 DIAGNOSIS — M25512 Pain in left shoulder: Secondary | ICD-10-CM | POA: Diagnosis not present

## 2023-06-20 DIAGNOSIS — R58 Hemorrhage, not elsewhere classified: Secondary | ICD-10-CM | POA: Diagnosis not present

## 2023-06-20 DIAGNOSIS — R600 Localized edema: Secondary | ICD-10-CM | POA: Diagnosis not present

## 2023-06-20 DIAGNOSIS — S0003XA Contusion of scalp, initial encounter: Secondary | ICD-10-CM | POA: Diagnosis not present

## 2023-06-26 DIAGNOSIS — R251 Tremor, unspecified: Secondary | ICD-10-CM | POA: Diagnosis not present

## 2023-06-26 DIAGNOSIS — G309 Alzheimer's disease, unspecified: Secondary | ICD-10-CM | POA: Diagnosis not present

## 2023-07-16 DIAGNOSIS — R413 Other amnesia: Secondary | ICD-10-CM | POA: Diagnosis not present

## 2023-08-09 DIAGNOSIS — D509 Iron deficiency anemia, unspecified: Secondary | ICD-10-CM | POA: Diagnosis not present

## 2023-08-09 DIAGNOSIS — I10 Essential (primary) hypertension: Secondary | ICD-10-CM | POA: Diagnosis not present

## 2023-08-09 DIAGNOSIS — G309 Alzheimer's disease, unspecified: Secondary | ICD-10-CM | POA: Diagnosis not present

## 2023-08-09 DIAGNOSIS — R600 Localized edema: Secondary | ICD-10-CM | POA: Diagnosis not present

## 2023-08-09 DIAGNOSIS — E039 Hypothyroidism, unspecified: Secondary | ICD-10-CM | POA: Diagnosis not present

## 2023-08-15 DIAGNOSIS — M79671 Pain in right foot: Secondary | ICD-10-CM | POA: Diagnosis not present

## 2023-09-16 DIAGNOSIS — W01198A Fall on same level from slipping, tripping and stumbling with subsequent striking against other object, initial encounter: Secondary | ICD-10-CM | POA: Diagnosis not present

## 2023-09-16 DIAGNOSIS — S8000XA Contusion of unspecified knee, initial encounter: Secondary | ICD-10-CM | POA: Diagnosis not present

## 2023-09-16 DIAGNOSIS — S60222A Contusion of left hand, initial encounter: Secondary | ICD-10-CM | POA: Diagnosis not present

## 2023-09-16 DIAGNOSIS — M79641 Pain in right hand: Secondary | ICD-10-CM | POA: Diagnosis not present

## 2023-09-16 DIAGNOSIS — M79642 Pain in left hand: Secondary | ICD-10-CM | POA: Diagnosis not present

## 2023-09-16 DIAGNOSIS — S8002XA Contusion of left knee, initial encounter: Secondary | ICD-10-CM | POA: Diagnosis not present

## 2023-09-16 DIAGNOSIS — Z043 Encounter for examination and observation following other accident: Secondary | ICD-10-CM | POA: Diagnosis not present

## 2023-09-16 DIAGNOSIS — S0003XA Contusion of scalp, initial encounter: Secondary | ICD-10-CM | POA: Diagnosis not present

## 2023-09-16 DIAGNOSIS — S8001XA Contusion of right knee, initial encounter: Secondary | ICD-10-CM | POA: Diagnosis not present

## 2023-09-16 DIAGNOSIS — S60221A Contusion of right hand, initial encounter: Secondary | ICD-10-CM | POA: Diagnosis not present

## 2023-10-07 DIAGNOSIS — I959 Hypotension, unspecified: Secondary | ICD-10-CM | POA: Diagnosis not present

## 2023-10-07 DIAGNOSIS — R6889 Other general symptoms and signs: Secondary | ICD-10-CM | POA: Diagnosis not present

## 2023-10-07 DIAGNOSIS — R531 Weakness: Secondary | ICD-10-CM | POA: Diagnosis not present

## 2023-10-07 DIAGNOSIS — E039 Hypothyroidism, unspecified: Secondary | ICD-10-CM | POA: Diagnosis not present

## 2023-10-07 DIAGNOSIS — R9431 Abnormal electrocardiogram [ECG] [EKG]: Secondary | ICD-10-CM | POA: Diagnosis not present

## 2023-10-07 DIAGNOSIS — R2681 Unsteadiness on feet: Secondary | ICD-10-CM | POA: Diagnosis not present

## 2023-10-07 DIAGNOSIS — E860 Dehydration: Secondary | ICD-10-CM | POA: Diagnosis not present

## 2023-10-07 DIAGNOSIS — D509 Iron deficiency anemia, unspecified: Secondary | ICD-10-CM | POA: Diagnosis not present

## 2023-10-07 DIAGNOSIS — A419 Sepsis, unspecified organism: Secondary | ICD-10-CM | POA: Diagnosis not present

## 2023-10-07 DIAGNOSIS — R110 Nausea: Secondary | ICD-10-CM | POA: Diagnosis not present

## 2023-10-07 DIAGNOSIS — Z79899 Other long term (current) drug therapy: Secondary | ICD-10-CM | POA: Diagnosis not present

## 2023-10-07 DIAGNOSIS — Z20822 Contact with and (suspected) exposure to covid-19: Secondary | ICD-10-CM | POA: Diagnosis not present

## 2023-10-07 DIAGNOSIS — K5730 Diverticulosis of large intestine without perforation or abscess without bleeding: Secondary | ICD-10-CM | POA: Diagnosis not present

## 2023-10-07 DIAGNOSIS — Z7902 Long term (current) use of antithrombotics/antiplatelets: Secondary | ICD-10-CM | POA: Diagnosis not present

## 2023-10-07 DIAGNOSIS — Z743 Need for continuous supervision: Secondary | ICD-10-CM | POA: Diagnosis not present

## 2023-10-07 DIAGNOSIS — N179 Acute kidney failure, unspecified: Secondary | ICD-10-CM | POA: Diagnosis not present

## 2023-10-07 DIAGNOSIS — E785 Hyperlipidemia, unspecified: Secondary | ICD-10-CM | POA: Diagnosis not present

## 2023-10-07 DIAGNOSIS — Z8673 Personal history of transient ischemic attack (TIA), and cerebral infarction without residual deficits: Secondary | ICD-10-CM | POA: Diagnosis not present

## 2023-10-07 DIAGNOSIS — R5383 Other fatigue: Secondary | ICD-10-CM | POA: Diagnosis not present

## 2023-10-07 DIAGNOSIS — Z9689 Presence of other specified functional implants: Secondary | ICD-10-CM | POA: Diagnosis not present

## 2023-10-08 DIAGNOSIS — N179 Acute kidney failure, unspecified: Secondary | ICD-10-CM | POA: Diagnosis not present

## 2023-10-08 DIAGNOSIS — I959 Hypotension, unspecified: Secondary | ICD-10-CM | POA: Diagnosis not present

## 2023-10-10 DIAGNOSIS — K5900 Constipation, unspecified: Secondary | ICD-10-CM | POA: Diagnosis not present

## 2023-10-10 DIAGNOSIS — D509 Iron deficiency anemia, unspecified: Secondary | ICD-10-CM | POA: Diagnosis not present

## 2023-10-10 DIAGNOSIS — R194 Change in bowel habit: Secondary | ICD-10-CM | POA: Diagnosis not present

## 2023-10-11 DIAGNOSIS — M48061 Spinal stenosis, lumbar region without neurogenic claudication: Secondary | ICD-10-CM | POA: Diagnosis not present

## 2023-10-11 DIAGNOSIS — M47812 Spondylosis without myelopathy or radiculopathy, cervical region: Secondary | ICD-10-CM | POA: Diagnosis not present

## 2023-10-11 DIAGNOSIS — I10 Essential (primary) hypertension: Secondary | ICD-10-CM | POA: Diagnosis not present

## 2023-10-11 DIAGNOSIS — K219 Gastro-esophageal reflux disease without esophagitis: Secondary | ICD-10-CM | POA: Diagnosis not present

## 2023-10-11 DIAGNOSIS — E785 Hyperlipidemia, unspecified: Secondary | ICD-10-CM | POA: Diagnosis not present

## 2023-10-11 DIAGNOSIS — M47816 Spondylosis without myelopathy or radiculopathy, lumbar region: Secondary | ICD-10-CM | POA: Diagnosis not present

## 2023-10-11 DIAGNOSIS — R262 Difficulty in walking, not elsewhere classified: Secondary | ICD-10-CM | POA: Diagnosis not present

## 2023-10-11 DIAGNOSIS — I959 Hypotension, unspecified: Secondary | ICD-10-CM | POA: Diagnosis not present

## 2023-10-11 DIAGNOSIS — E039 Hypothyroidism, unspecified: Secondary | ICD-10-CM | POA: Diagnosis not present

## 2023-10-11 DIAGNOSIS — E860 Dehydration: Secondary | ICD-10-CM | POA: Diagnosis not present

## 2023-10-11 DIAGNOSIS — D509 Iron deficiency anemia, unspecified: Secondary | ICD-10-CM | POA: Diagnosis not present

## 2023-10-11 DIAGNOSIS — G250 Essential tremor: Secondary | ICD-10-CM | POA: Diagnosis not present

## 2023-10-11 DIAGNOSIS — M1990 Unspecified osteoarthritis, unspecified site: Secondary | ICD-10-CM | POA: Diagnosis not present

## 2023-10-11 DIAGNOSIS — K5730 Diverticulosis of large intestine without perforation or abscess without bleeding: Secondary | ICD-10-CM | POA: Diagnosis not present

## 2023-10-11 DIAGNOSIS — N179 Acute kidney failure, unspecified: Secondary | ICD-10-CM | POA: Diagnosis not present

## 2023-10-11 DIAGNOSIS — G309 Alzheimer's disease, unspecified: Secondary | ICD-10-CM | POA: Diagnosis not present

## 2023-10-11 DIAGNOSIS — M8580 Other specified disorders of bone density and structure, unspecified site: Secondary | ICD-10-CM | POA: Diagnosis not present

## 2023-10-11 DIAGNOSIS — R600 Localized edema: Secondary | ICD-10-CM | POA: Diagnosis not present

## 2023-10-11 DIAGNOSIS — G43909 Migraine, unspecified, not intractable, without status migrainosus: Secondary | ICD-10-CM | POA: Diagnosis not present

## 2023-10-11 DIAGNOSIS — M51369 Other intervertebral disc degeneration, lumbar region without mention of lumbar back pain or lower extremity pain: Secondary | ICD-10-CM | POA: Diagnosis not present

## 2023-10-11 DIAGNOSIS — I950 Idiopathic hypotension: Secondary | ICD-10-CM | POA: Diagnosis not present

## 2023-10-12 DIAGNOSIS — M8580 Other specified disorders of bone density and structure, unspecified site: Secondary | ICD-10-CM | POA: Diagnosis not present

## 2023-10-12 DIAGNOSIS — E860 Dehydration: Secondary | ICD-10-CM | POA: Diagnosis not present

## 2023-10-12 DIAGNOSIS — M51369 Other intervertebral disc degeneration, lumbar region without mention of lumbar back pain or lower extremity pain: Secondary | ICD-10-CM | POA: Diagnosis not present

## 2023-10-12 DIAGNOSIS — E039 Hypothyroidism, unspecified: Secondary | ICD-10-CM | POA: Diagnosis not present

## 2023-10-12 DIAGNOSIS — E785 Hyperlipidemia, unspecified: Secondary | ICD-10-CM | POA: Diagnosis not present

## 2023-10-12 DIAGNOSIS — M1990 Unspecified osteoarthritis, unspecified site: Secondary | ICD-10-CM | POA: Diagnosis not present

## 2023-10-12 DIAGNOSIS — I950 Idiopathic hypotension: Secondary | ICD-10-CM | POA: Diagnosis not present

## 2023-10-12 DIAGNOSIS — N179 Acute kidney failure, unspecified: Secondary | ICD-10-CM | POA: Diagnosis not present

## 2023-10-12 DIAGNOSIS — K5730 Diverticulosis of large intestine without perforation or abscess without bleeding: Secondary | ICD-10-CM | POA: Diagnosis not present

## 2023-10-12 DIAGNOSIS — M47816 Spondylosis without myelopathy or radiculopathy, lumbar region: Secondary | ICD-10-CM | POA: Diagnosis not present

## 2023-10-12 DIAGNOSIS — M48061 Spinal stenosis, lumbar region without neurogenic claudication: Secondary | ICD-10-CM | POA: Diagnosis not present

## 2023-10-12 DIAGNOSIS — G309 Alzheimer's disease, unspecified: Secondary | ICD-10-CM | POA: Diagnosis not present

## 2023-10-12 DIAGNOSIS — M47812 Spondylosis without myelopathy or radiculopathy, cervical region: Secondary | ICD-10-CM | POA: Diagnosis not present

## 2023-10-12 DIAGNOSIS — D509 Iron deficiency anemia, unspecified: Secondary | ICD-10-CM | POA: Diagnosis not present

## 2023-10-12 DIAGNOSIS — I10 Essential (primary) hypertension: Secondary | ICD-10-CM | POA: Diagnosis not present

## 2023-10-12 DIAGNOSIS — G250 Essential tremor: Secondary | ICD-10-CM | POA: Diagnosis not present

## 2023-10-12 DIAGNOSIS — K219 Gastro-esophageal reflux disease without esophagitis: Secondary | ICD-10-CM | POA: Diagnosis not present

## 2023-10-12 DIAGNOSIS — G43909 Migraine, unspecified, not intractable, without status migrainosus: Secondary | ICD-10-CM | POA: Diagnosis not present

## 2023-10-15 DIAGNOSIS — M48061 Spinal stenosis, lumbar region without neurogenic claudication: Secondary | ICD-10-CM | POA: Diagnosis not present

## 2023-10-15 DIAGNOSIS — M8580 Other specified disorders of bone density and structure, unspecified site: Secondary | ICD-10-CM | POA: Diagnosis not present

## 2023-10-15 DIAGNOSIS — I10 Essential (primary) hypertension: Secondary | ICD-10-CM | POA: Diagnosis not present

## 2023-10-15 DIAGNOSIS — M1990 Unspecified osteoarthritis, unspecified site: Secondary | ICD-10-CM | POA: Diagnosis not present

## 2023-10-15 DIAGNOSIS — K5730 Diverticulosis of large intestine without perforation or abscess without bleeding: Secondary | ICD-10-CM | POA: Diagnosis not present

## 2023-10-15 DIAGNOSIS — D509 Iron deficiency anemia, unspecified: Secondary | ICD-10-CM | POA: Diagnosis not present

## 2023-10-15 DIAGNOSIS — G309 Alzheimer's disease, unspecified: Secondary | ICD-10-CM | POA: Diagnosis not present

## 2023-10-15 DIAGNOSIS — G250 Essential tremor: Secondary | ICD-10-CM | POA: Diagnosis not present

## 2023-10-15 DIAGNOSIS — M51369 Other intervertebral disc degeneration, lumbar region without mention of lumbar back pain or lower extremity pain: Secondary | ICD-10-CM | POA: Diagnosis not present

## 2023-10-15 DIAGNOSIS — M47816 Spondylosis without myelopathy or radiculopathy, lumbar region: Secondary | ICD-10-CM | POA: Diagnosis not present

## 2023-10-15 DIAGNOSIS — K219 Gastro-esophageal reflux disease without esophagitis: Secondary | ICD-10-CM | POA: Diagnosis not present

## 2023-10-15 DIAGNOSIS — I950 Idiopathic hypotension: Secondary | ICD-10-CM | POA: Diagnosis not present

## 2023-10-15 DIAGNOSIS — G43909 Migraine, unspecified, not intractable, without status migrainosus: Secondary | ICD-10-CM | POA: Diagnosis not present

## 2023-10-15 DIAGNOSIS — E785 Hyperlipidemia, unspecified: Secondary | ICD-10-CM | POA: Diagnosis not present

## 2023-10-15 DIAGNOSIS — N179 Acute kidney failure, unspecified: Secondary | ICD-10-CM | POA: Diagnosis not present

## 2023-10-15 DIAGNOSIS — E860 Dehydration: Secondary | ICD-10-CM | POA: Diagnosis not present

## 2023-10-15 DIAGNOSIS — E039 Hypothyroidism, unspecified: Secondary | ICD-10-CM | POA: Diagnosis not present

## 2023-10-15 DIAGNOSIS — M47812 Spondylosis without myelopathy or radiculopathy, cervical region: Secondary | ICD-10-CM | POA: Diagnosis not present

## 2023-10-17 DIAGNOSIS — E039 Hypothyroidism, unspecified: Secondary | ICD-10-CM | POA: Diagnosis not present

## 2023-10-17 DIAGNOSIS — K5730 Diverticulosis of large intestine without perforation or abscess without bleeding: Secondary | ICD-10-CM | POA: Diagnosis not present

## 2023-10-17 DIAGNOSIS — I950 Idiopathic hypotension: Secondary | ICD-10-CM | POA: Diagnosis not present

## 2023-10-17 DIAGNOSIS — G250 Essential tremor: Secondary | ICD-10-CM | POA: Diagnosis not present

## 2023-10-17 DIAGNOSIS — M8580 Other specified disorders of bone density and structure, unspecified site: Secondary | ICD-10-CM | POA: Diagnosis not present

## 2023-10-17 DIAGNOSIS — N179 Acute kidney failure, unspecified: Secondary | ICD-10-CM | POA: Diagnosis not present

## 2023-10-17 DIAGNOSIS — M19071 Primary osteoarthritis, right ankle and foot: Secondary | ICD-10-CM | POA: Diagnosis not present

## 2023-10-17 DIAGNOSIS — M48061 Spinal stenosis, lumbar region without neurogenic claudication: Secondary | ICD-10-CM | POA: Diagnosis not present

## 2023-10-17 DIAGNOSIS — E785 Hyperlipidemia, unspecified: Secondary | ICD-10-CM | POA: Diagnosis not present

## 2023-10-17 DIAGNOSIS — I10 Essential (primary) hypertension: Secondary | ICD-10-CM | POA: Diagnosis not present

## 2023-10-17 DIAGNOSIS — E860 Dehydration: Secondary | ICD-10-CM | POA: Diagnosis not present

## 2023-10-17 DIAGNOSIS — M51369 Other intervertebral disc degeneration, lumbar region without mention of lumbar back pain or lower extremity pain: Secondary | ICD-10-CM | POA: Diagnosis not present

## 2023-10-17 DIAGNOSIS — M47812 Spondylosis without myelopathy or radiculopathy, cervical region: Secondary | ICD-10-CM | POA: Diagnosis not present

## 2023-10-17 DIAGNOSIS — M47816 Spondylosis without myelopathy or radiculopathy, lumbar region: Secondary | ICD-10-CM | POA: Diagnosis not present

## 2023-10-17 DIAGNOSIS — G309 Alzheimer's disease, unspecified: Secondary | ICD-10-CM | POA: Diagnosis not present

## 2023-10-17 DIAGNOSIS — G43909 Migraine, unspecified, not intractable, without status migrainosus: Secondary | ICD-10-CM | POA: Diagnosis not present

## 2023-10-17 DIAGNOSIS — K219 Gastro-esophageal reflux disease without esophagitis: Secondary | ICD-10-CM | POA: Diagnosis not present

## 2023-10-17 DIAGNOSIS — M1990 Unspecified osteoarthritis, unspecified site: Secondary | ICD-10-CM | POA: Diagnosis not present

## 2023-10-17 DIAGNOSIS — D509 Iron deficiency anemia, unspecified: Secondary | ICD-10-CM | POA: Diagnosis not present

## 2023-10-23 DIAGNOSIS — M47816 Spondylosis without myelopathy or radiculopathy, lumbar region: Secondary | ICD-10-CM | POA: Diagnosis not present

## 2023-10-23 DIAGNOSIS — E039 Hypothyroidism, unspecified: Secondary | ICD-10-CM | POA: Diagnosis not present

## 2023-10-23 DIAGNOSIS — M1990 Unspecified osteoarthritis, unspecified site: Secondary | ICD-10-CM | POA: Diagnosis not present

## 2023-10-23 DIAGNOSIS — I10 Essential (primary) hypertension: Secondary | ICD-10-CM | POA: Diagnosis not present

## 2023-10-23 DIAGNOSIS — M47812 Spondylosis without myelopathy or radiculopathy, cervical region: Secondary | ICD-10-CM | POA: Diagnosis not present

## 2023-10-23 DIAGNOSIS — K5730 Diverticulosis of large intestine without perforation or abscess without bleeding: Secondary | ICD-10-CM | POA: Diagnosis not present

## 2023-10-23 DIAGNOSIS — G250 Essential tremor: Secondary | ICD-10-CM | POA: Diagnosis not present

## 2023-10-23 DIAGNOSIS — E860 Dehydration: Secondary | ICD-10-CM | POA: Diagnosis not present

## 2023-10-23 DIAGNOSIS — D509 Iron deficiency anemia, unspecified: Secondary | ICD-10-CM | POA: Diagnosis not present

## 2023-10-23 DIAGNOSIS — M8580 Other specified disorders of bone density and structure, unspecified site: Secondary | ICD-10-CM | POA: Diagnosis not present

## 2023-10-23 DIAGNOSIS — K219 Gastro-esophageal reflux disease without esophagitis: Secondary | ICD-10-CM | POA: Diagnosis not present

## 2023-10-23 DIAGNOSIS — G43909 Migraine, unspecified, not intractable, without status migrainosus: Secondary | ICD-10-CM | POA: Diagnosis not present

## 2023-10-23 DIAGNOSIS — E785 Hyperlipidemia, unspecified: Secondary | ICD-10-CM | POA: Diagnosis not present

## 2023-10-23 DIAGNOSIS — G309 Alzheimer's disease, unspecified: Secondary | ICD-10-CM | POA: Diagnosis not present

## 2023-10-23 DIAGNOSIS — M51369 Other intervertebral disc degeneration, lumbar region without mention of lumbar back pain or lower extremity pain: Secondary | ICD-10-CM | POA: Diagnosis not present

## 2023-10-23 DIAGNOSIS — I950 Idiopathic hypotension: Secondary | ICD-10-CM | POA: Diagnosis not present

## 2023-10-23 DIAGNOSIS — M48061 Spinal stenosis, lumbar region without neurogenic claudication: Secondary | ICD-10-CM | POA: Diagnosis not present

## 2023-10-23 DIAGNOSIS — N179 Acute kidney failure, unspecified: Secondary | ICD-10-CM | POA: Diagnosis not present

## 2023-10-24 DIAGNOSIS — D509 Iron deficiency anemia, unspecified: Secondary | ICD-10-CM | POA: Diagnosis not present

## 2023-10-24 DIAGNOSIS — I950 Idiopathic hypotension: Secondary | ICD-10-CM | POA: Diagnosis not present

## 2023-10-24 DIAGNOSIS — M1990 Unspecified osteoarthritis, unspecified site: Secondary | ICD-10-CM | POA: Diagnosis not present

## 2023-10-24 DIAGNOSIS — E039 Hypothyroidism, unspecified: Secondary | ICD-10-CM | POA: Diagnosis not present

## 2023-10-24 DIAGNOSIS — G43909 Migraine, unspecified, not intractable, without status migrainosus: Secondary | ICD-10-CM | POA: Diagnosis not present

## 2023-10-24 DIAGNOSIS — M47812 Spondylosis without myelopathy or radiculopathy, cervical region: Secondary | ICD-10-CM | POA: Diagnosis not present

## 2023-10-24 DIAGNOSIS — E785 Hyperlipidemia, unspecified: Secondary | ICD-10-CM | POA: Diagnosis not present

## 2023-10-24 DIAGNOSIS — G309 Alzheimer's disease, unspecified: Secondary | ICD-10-CM | POA: Diagnosis not present

## 2023-10-24 DIAGNOSIS — M47816 Spondylosis without myelopathy or radiculopathy, lumbar region: Secondary | ICD-10-CM | POA: Diagnosis not present

## 2023-10-24 DIAGNOSIS — M48061 Spinal stenosis, lumbar region without neurogenic claudication: Secondary | ICD-10-CM | POA: Diagnosis not present

## 2023-10-24 DIAGNOSIS — I10 Essential (primary) hypertension: Secondary | ICD-10-CM | POA: Diagnosis not present

## 2023-10-24 DIAGNOSIS — N179 Acute kidney failure, unspecified: Secondary | ICD-10-CM | POA: Diagnosis not present

## 2023-10-24 DIAGNOSIS — K219 Gastro-esophageal reflux disease without esophagitis: Secondary | ICD-10-CM | POA: Diagnosis not present

## 2023-10-24 DIAGNOSIS — G250 Essential tremor: Secondary | ICD-10-CM | POA: Diagnosis not present

## 2023-10-24 DIAGNOSIS — M51369 Other intervertebral disc degeneration, lumbar region without mention of lumbar back pain or lower extremity pain: Secondary | ICD-10-CM | POA: Diagnosis not present

## 2023-10-24 DIAGNOSIS — E860 Dehydration: Secondary | ICD-10-CM | POA: Diagnosis not present

## 2023-10-24 DIAGNOSIS — K5730 Diverticulosis of large intestine without perforation or abscess without bleeding: Secondary | ICD-10-CM | POA: Diagnosis not present

## 2023-10-24 DIAGNOSIS — M8580 Other specified disorders of bone density and structure, unspecified site: Secondary | ICD-10-CM | POA: Diagnosis not present

## 2023-10-25 DIAGNOSIS — E785 Hyperlipidemia, unspecified: Secondary | ICD-10-CM | POA: Diagnosis not present

## 2023-10-25 DIAGNOSIS — M48061 Spinal stenosis, lumbar region without neurogenic claudication: Secondary | ICD-10-CM | POA: Diagnosis not present

## 2023-10-25 DIAGNOSIS — D509 Iron deficiency anemia, unspecified: Secondary | ICD-10-CM | POA: Diagnosis not present

## 2023-10-25 DIAGNOSIS — E039 Hypothyroidism, unspecified: Secondary | ICD-10-CM | POA: Diagnosis not present

## 2023-10-25 DIAGNOSIS — K219 Gastro-esophageal reflux disease without esophagitis: Secondary | ICD-10-CM | POA: Diagnosis not present

## 2023-10-25 DIAGNOSIS — E860 Dehydration: Secondary | ICD-10-CM | POA: Diagnosis not present

## 2023-10-25 DIAGNOSIS — G250 Essential tremor: Secondary | ICD-10-CM | POA: Diagnosis not present

## 2023-10-25 DIAGNOSIS — M47812 Spondylosis without myelopathy or radiculopathy, cervical region: Secondary | ICD-10-CM | POA: Diagnosis not present

## 2023-10-25 DIAGNOSIS — M1990 Unspecified osteoarthritis, unspecified site: Secondary | ICD-10-CM | POA: Diagnosis not present

## 2023-10-25 DIAGNOSIS — M8580 Other specified disorders of bone density and structure, unspecified site: Secondary | ICD-10-CM | POA: Diagnosis not present

## 2023-10-25 DIAGNOSIS — M47816 Spondylosis without myelopathy or radiculopathy, lumbar region: Secondary | ICD-10-CM | POA: Diagnosis not present

## 2023-10-25 DIAGNOSIS — I10 Essential (primary) hypertension: Secondary | ICD-10-CM | POA: Diagnosis not present

## 2023-10-25 DIAGNOSIS — M51369 Other intervertebral disc degeneration, lumbar region without mention of lumbar back pain or lower extremity pain: Secondary | ICD-10-CM | POA: Diagnosis not present

## 2023-10-25 DIAGNOSIS — G43909 Migraine, unspecified, not intractable, without status migrainosus: Secondary | ICD-10-CM | POA: Diagnosis not present

## 2023-10-25 DIAGNOSIS — N179 Acute kidney failure, unspecified: Secondary | ICD-10-CM | POA: Diagnosis not present

## 2023-10-25 DIAGNOSIS — I950 Idiopathic hypotension: Secondary | ICD-10-CM | POA: Diagnosis not present

## 2023-10-25 DIAGNOSIS — G309 Alzheimer's disease, unspecified: Secondary | ICD-10-CM | POA: Diagnosis not present

## 2023-10-25 DIAGNOSIS — K5730 Diverticulosis of large intestine without perforation or abscess without bleeding: Secondary | ICD-10-CM | POA: Diagnosis not present

## 2023-10-26 DIAGNOSIS — J069 Acute upper respiratory infection, unspecified: Secondary | ICD-10-CM | POA: Diagnosis not present

## 2023-10-30 DIAGNOSIS — G309 Alzheimer's disease, unspecified: Secondary | ICD-10-CM | POA: Diagnosis not present

## 2023-10-30 DIAGNOSIS — K5730 Diverticulosis of large intestine without perforation or abscess without bleeding: Secondary | ICD-10-CM | POA: Diagnosis not present

## 2023-10-30 DIAGNOSIS — I950 Idiopathic hypotension: Secondary | ICD-10-CM | POA: Diagnosis not present

## 2023-10-30 DIAGNOSIS — G43909 Migraine, unspecified, not intractable, without status migrainosus: Secondary | ICD-10-CM | POA: Diagnosis not present

## 2023-10-30 DIAGNOSIS — E039 Hypothyroidism, unspecified: Secondary | ICD-10-CM | POA: Diagnosis not present

## 2023-10-30 DIAGNOSIS — M51369 Other intervertebral disc degeneration, lumbar region without mention of lumbar back pain or lower extremity pain: Secondary | ICD-10-CM | POA: Diagnosis not present

## 2023-10-30 DIAGNOSIS — M8580 Other specified disorders of bone density and structure, unspecified site: Secondary | ICD-10-CM | POA: Diagnosis not present

## 2023-10-30 DIAGNOSIS — M1990 Unspecified osteoarthritis, unspecified site: Secondary | ICD-10-CM | POA: Diagnosis not present

## 2023-10-30 DIAGNOSIS — E860 Dehydration: Secondary | ICD-10-CM | POA: Diagnosis not present

## 2023-10-30 DIAGNOSIS — D509 Iron deficiency anemia, unspecified: Secondary | ICD-10-CM | POA: Diagnosis not present

## 2023-10-30 DIAGNOSIS — N179 Acute kidney failure, unspecified: Secondary | ICD-10-CM | POA: Diagnosis not present

## 2023-10-30 DIAGNOSIS — M47812 Spondylosis without myelopathy or radiculopathy, cervical region: Secondary | ICD-10-CM | POA: Diagnosis not present

## 2023-10-30 DIAGNOSIS — E785 Hyperlipidemia, unspecified: Secondary | ICD-10-CM | POA: Diagnosis not present

## 2023-10-30 DIAGNOSIS — M47816 Spondylosis without myelopathy or radiculopathy, lumbar region: Secondary | ICD-10-CM | POA: Diagnosis not present

## 2023-10-30 DIAGNOSIS — G250 Essential tremor: Secondary | ICD-10-CM | POA: Diagnosis not present

## 2023-10-30 DIAGNOSIS — I10 Essential (primary) hypertension: Secondary | ICD-10-CM | POA: Diagnosis not present

## 2023-10-30 DIAGNOSIS — K219 Gastro-esophageal reflux disease without esophagitis: Secondary | ICD-10-CM | POA: Diagnosis not present

## 2023-10-30 DIAGNOSIS — M48061 Spinal stenosis, lumbar region without neurogenic claudication: Secondary | ICD-10-CM | POA: Diagnosis not present

## 2023-11-01 DIAGNOSIS — G250 Essential tremor: Secondary | ICD-10-CM | POA: Diagnosis not present

## 2023-11-01 DIAGNOSIS — N179 Acute kidney failure, unspecified: Secondary | ICD-10-CM | POA: Diagnosis not present

## 2023-11-01 DIAGNOSIS — M51369 Other intervertebral disc degeneration, lumbar region without mention of lumbar back pain or lower extremity pain: Secondary | ICD-10-CM | POA: Diagnosis not present

## 2023-11-01 DIAGNOSIS — M8580 Other specified disorders of bone density and structure, unspecified site: Secondary | ICD-10-CM | POA: Diagnosis not present

## 2023-11-01 DIAGNOSIS — M47812 Spondylosis without myelopathy or radiculopathy, cervical region: Secondary | ICD-10-CM | POA: Diagnosis not present

## 2023-11-01 DIAGNOSIS — G309 Alzheimer's disease, unspecified: Secondary | ICD-10-CM | POA: Diagnosis not present

## 2023-11-01 DIAGNOSIS — I950 Idiopathic hypotension: Secondary | ICD-10-CM | POA: Diagnosis not present

## 2023-11-01 DIAGNOSIS — D509 Iron deficiency anemia, unspecified: Secondary | ICD-10-CM | POA: Diagnosis not present

## 2023-11-01 DIAGNOSIS — K5730 Diverticulosis of large intestine without perforation or abscess without bleeding: Secondary | ICD-10-CM | POA: Diagnosis not present

## 2023-11-01 DIAGNOSIS — M48061 Spinal stenosis, lumbar region without neurogenic claudication: Secondary | ICD-10-CM | POA: Diagnosis not present

## 2023-11-01 DIAGNOSIS — E785 Hyperlipidemia, unspecified: Secondary | ICD-10-CM | POA: Diagnosis not present

## 2023-11-01 DIAGNOSIS — E860 Dehydration: Secondary | ICD-10-CM | POA: Diagnosis not present

## 2023-11-01 DIAGNOSIS — G43909 Migraine, unspecified, not intractable, without status migrainosus: Secondary | ICD-10-CM | POA: Diagnosis not present

## 2023-11-01 DIAGNOSIS — K219 Gastro-esophageal reflux disease without esophagitis: Secondary | ICD-10-CM | POA: Diagnosis not present

## 2023-11-01 DIAGNOSIS — I10 Essential (primary) hypertension: Secondary | ICD-10-CM | POA: Diagnosis not present

## 2023-11-01 DIAGNOSIS — E039 Hypothyroidism, unspecified: Secondary | ICD-10-CM | POA: Diagnosis not present

## 2023-11-01 DIAGNOSIS — M47816 Spondylosis without myelopathy or radiculopathy, lumbar region: Secondary | ICD-10-CM | POA: Diagnosis not present

## 2023-11-01 DIAGNOSIS — M1990 Unspecified osteoarthritis, unspecified site: Secondary | ICD-10-CM | POA: Diagnosis not present

## 2023-11-08 DIAGNOSIS — E860 Dehydration: Secondary | ICD-10-CM | POA: Diagnosis not present

## 2023-11-08 DIAGNOSIS — M48061 Spinal stenosis, lumbar region without neurogenic claudication: Secondary | ICD-10-CM | POA: Diagnosis not present

## 2023-11-08 DIAGNOSIS — G250 Essential tremor: Secondary | ICD-10-CM | POA: Diagnosis not present

## 2023-11-08 DIAGNOSIS — D509 Iron deficiency anemia, unspecified: Secondary | ICD-10-CM | POA: Diagnosis not present

## 2023-11-08 DIAGNOSIS — M1990 Unspecified osteoarthritis, unspecified site: Secondary | ICD-10-CM | POA: Diagnosis not present

## 2023-11-08 DIAGNOSIS — G309 Alzheimer's disease, unspecified: Secondary | ICD-10-CM | POA: Diagnosis not present

## 2023-11-08 DIAGNOSIS — N179 Acute kidney failure, unspecified: Secondary | ICD-10-CM | POA: Diagnosis not present

## 2023-11-08 DIAGNOSIS — M51369 Other intervertebral disc degeneration, lumbar region without mention of lumbar back pain or lower extremity pain: Secondary | ICD-10-CM | POA: Diagnosis not present

## 2023-11-08 DIAGNOSIS — G43909 Migraine, unspecified, not intractable, without status migrainosus: Secondary | ICD-10-CM | POA: Diagnosis not present

## 2023-11-08 DIAGNOSIS — K219 Gastro-esophageal reflux disease without esophagitis: Secondary | ICD-10-CM | POA: Diagnosis not present

## 2023-11-08 DIAGNOSIS — K5730 Diverticulosis of large intestine without perforation or abscess without bleeding: Secondary | ICD-10-CM | POA: Diagnosis not present

## 2023-11-08 DIAGNOSIS — E785 Hyperlipidemia, unspecified: Secondary | ICD-10-CM | POA: Diagnosis not present

## 2023-11-08 DIAGNOSIS — M8580 Other specified disorders of bone density and structure, unspecified site: Secondary | ICD-10-CM | POA: Diagnosis not present

## 2023-11-08 DIAGNOSIS — E039 Hypothyroidism, unspecified: Secondary | ICD-10-CM | POA: Diagnosis not present

## 2023-11-08 DIAGNOSIS — M47816 Spondylosis without myelopathy or radiculopathy, lumbar region: Secondary | ICD-10-CM | POA: Diagnosis not present

## 2023-11-08 DIAGNOSIS — I10 Essential (primary) hypertension: Secondary | ICD-10-CM | POA: Diagnosis not present

## 2023-11-08 DIAGNOSIS — I950 Idiopathic hypotension: Secondary | ICD-10-CM | POA: Diagnosis not present

## 2023-11-08 DIAGNOSIS — M47812 Spondylosis without myelopathy or radiculopathy, cervical region: Secondary | ICD-10-CM | POA: Diagnosis not present

## 2023-11-09 DIAGNOSIS — R262 Difficulty in walking, not elsewhere classified: Secondary | ICD-10-CM | POA: Diagnosis not present

## 2023-11-13 DIAGNOSIS — K219 Gastro-esophageal reflux disease without esophagitis: Secondary | ICD-10-CM | POA: Diagnosis not present

## 2023-11-13 DIAGNOSIS — M1990 Unspecified osteoarthritis, unspecified site: Secondary | ICD-10-CM | POA: Diagnosis not present

## 2023-11-13 DIAGNOSIS — M51369 Other intervertebral disc degeneration, lumbar region without mention of lumbar back pain or lower extremity pain: Secondary | ICD-10-CM | POA: Diagnosis not present

## 2023-11-13 DIAGNOSIS — M48061 Spinal stenosis, lumbar region without neurogenic claudication: Secondary | ICD-10-CM | POA: Diagnosis not present

## 2023-11-13 DIAGNOSIS — G309 Alzheimer's disease, unspecified: Secondary | ICD-10-CM | POA: Diagnosis not present

## 2023-11-13 DIAGNOSIS — G43909 Migraine, unspecified, not intractable, without status migrainosus: Secondary | ICD-10-CM | POA: Diagnosis not present

## 2023-11-13 DIAGNOSIS — I10 Essential (primary) hypertension: Secondary | ICD-10-CM | POA: Diagnosis not present

## 2023-11-13 DIAGNOSIS — M47812 Spondylosis without myelopathy or radiculopathy, cervical region: Secondary | ICD-10-CM | POA: Diagnosis not present

## 2023-11-13 DIAGNOSIS — K5730 Diverticulosis of large intestine without perforation or abscess without bleeding: Secondary | ICD-10-CM | POA: Diagnosis not present

## 2023-11-13 DIAGNOSIS — D509 Iron deficiency anemia, unspecified: Secondary | ICD-10-CM | POA: Diagnosis not present

## 2023-11-13 DIAGNOSIS — I950 Idiopathic hypotension: Secondary | ICD-10-CM | POA: Diagnosis not present

## 2023-11-13 DIAGNOSIS — M8580 Other specified disorders of bone density and structure, unspecified site: Secondary | ICD-10-CM | POA: Diagnosis not present

## 2023-11-13 DIAGNOSIS — G250 Essential tremor: Secondary | ICD-10-CM | POA: Diagnosis not present

## 2023-11-13 DIAGNOSIS — E785 Hyperlipidemia, unspecified: Secondary | ICD-10-CM | POA: Diagnosis not present

## 2023-11-13 DIAGNOSIS — E860 Dehydration: Secondary | ICD-10-CM | POA: Diagnosis not present

## 2023-11-13 DIAGNOSIS — E039 Hypothyroidism, unspecified: Secondary | ICD-10-CM | POA: Diagnosis not present

## 2023-11-13 DIAGNOSIS — N179 Acute kidney failure, unspecified: Secondary | ICD-10-CM | POA: Diagnosis not present

## 2023-11-13 DIAGNOSIS — M47816 Spondylosis without myelopathy or radiculopathy, lumbar region: Secondary | ICD-10-CM | POA: Diagnosis not present

## 2023-11-16 DIAGNOSIS — M79651 Pain in right thigh: Secondary | ICD-10-CM | POA: Diagnosis not present

## 2023-11-19 DIAGNOSIS — G250 Essential tremor: Secondary | ICD-10-CM | POA: Diagnosis not present

## 2023-11-19 DIAGNOSIS — M51369 Other intervertebral disc degeneration, lumbar region without mention of lumbar back pain or lower extremity pain: Secondary | ICD-10-CM | POA: Diagnosis not present

## 2023-11-19 DIAGNOSIS — D509 Iron deficiency anemia, unspecified: Secondary | ICD-10-CM | POA: Diagnosis not present

## 2023-11-19 DIAGNOSIS — G309 Alzheimer's disease, unspecified: Secondary | ICD-10-CM | POA: Diagnosis not present

## 2023-11-19 DIAGNOSIS — N179 Acute kidney failure, unspecified: Secondary | ICD-10-CM | POA: Diagnosis not present

## 2023-11-19 DIAGNOSIS — I10 Essential (primary) hypertension: Secondary | ICD-10-CM | POA: Diagnosis not present

## 2023-11-19 DIAGNOSIS — M48061 Spinal stenosis, lumbar region without neurogenic claudication: Secondary | ICD-10-CM | POA: Diagnosis not present

## 2023-11-19 DIAGNOSIS — M8580 Other specified disorders of bone density and structure, unspecified site: Secondary | ICD-10-CM | POA: Diagnosis not present

## 2023-11-19 DIAGNOSIS — E785 Hyperlipidemia, unspecified: Secondary | ICD-10-CM | POA: Diagnosis not present

## 2023-11-19 DIAGNOSIS — M47812 Spondylosis without myelopathy or radiculopathy, cervical region: Secondary | ICD-10-CM | POA: Diagnosis not present

## 2023-11-19 DIAGNOSIS — M47816 Spondylosis without myelopathy or radiculopathy, lumbar region: Secondary | ICD-10-CM | POA: Diagnosis not present

## 2023-11-19 DIAGNOSIS — E860 Dehydration: Secondary | ICD-10-CM | POA: Diagnosis not present

## 2023-11-19 DIAGNOSIS — I950 Idiopathic hypotension: Secondary | ICD-10-CM | POA: Diagnosis not present

## 2023-11-19 DIAGNOSIS — K219 Gastro-esophageal reflux disease without esophagitis: Secondary | ICD-10-CM | POA: Diagnosis not present

## 2023-11-19 DIAGNOSIS — G43909 Migraine, unspecified, not intractable, without status migrainosus: Secondary | ICD-10-CM | POA: Diagnosis not present

## 2023-11-19 DIAGNOSIS — M1990 Unspecified osteoarthritis, unspecified site: Secondary | ICD-10-CM | POA: Diagnosis not present

## 2023-11-19 DIAGNOSIS — E039 Hypothyroidism, unspecified: Secondary | ICD-10-CM | POA: Diagnosis not present

## 2023-11-19 DIAGNOSIS — K5730 Diverticulosis of large intestine without perforation or abscess without bleeding: Secondary | ICD-10-CM | POA: Diagnosis not present

## 2023-11-29 DIAGNOSIS — M8580 Other specified disorders of bone density and structure, unspecified site: Secondary | ICD-10-CM | POA: Diagnosis not present

## 2023-11-29 DIAGNOSIS — M47816 Spondylosis without myelopathy or radiculopathy, lumbar region: Secondary | ICD-10-CM | POA: Diagnosis not present

## 2023-11-29 DIAGNOSIS — N179 Acute kidney failure, unspecified: Secondary | ICD-10-CM | POA: Diagnosis not present

## 2023-11-29 DIAGNOSIS — I950 Idiopathic hypotension: Secondary | ICD-10-CM | POA: Diagnosis not present

## 2023-11-29 DIAGNOSIS — E860 Dehydration: Secondary | ICD-10-CM | POA: Diagnosis not present

## 2023-11-29 DIAGNOSIS — E785 Hyperlipidemia, unspecified: Secondary | ICD-10-CM | POA: Diagnosis not present

## 2023-11-29 DIAGNOSIS — I10 Essential (primary) hypertension: Secondary | ICD-10-CM | POA: Diagnosis not present

## 2023-11-29 DIAGNOSIS — D509 Iron deficiency anemia, unspecified: Secondary | ICD-10-CM | POA: Diagnosis not present

## 2023-11-29 DIAGNOSIS — K5730 Diverticulosis of large intestine without perforation or abscess without bleeding: Secondary | ICD-10-CM | POA: Diagnosis not present

## 2023-11-29 DIAGNOSIS — M1990 Unspecified osteoarthritis, unspecified site: Secondary | ICD-10-CM | POA: Diagnosis not present

## 2023-11-29 DIAGNOSIS — G43909 Migraine, unspecified, not intractable, without status migrainosus: Secondary | ICD-10-CM | POA: Diagnosis not present

## 2023-11-29 DIAGNOSIS — M47812 Spondylosis without myelopathy or radiculopathy, cervical region: Secondary | ICD-10-CM | POA: Diagnosis not present

## 2023-11-29 DIAGNOSIS — M51369 Other intervertebral disc degeneration, lumbar region without mention of lumbar back pain or lower extremity pain: Secondary | ICD-10-CM | POA: Diagnosis not present

## 2023-11-29 DIAGNOSIS — G250 Essential tremor: Secondary | ICD-10-CM | POA: Diagnosis not present

## 2023-11-29 DIAGNOSIS — K219 Gastro-esophageal reflux disease without esophagitis: Secondary | ICD-10-CM | POA: Diagnosis not present

## 2023-11-29 DIAGNOSIS — G309 Alzheimer's disease, unspecified: Secondary | ICD-10-CM | POA: Diagnosis not present

## 2023-11-29 DIAGNOSIS — E039 Hypothyroidism, unspecified: Secondary | ICD-10-CM | POA: Diagnosis not present

## 2023-11-29 DIAGNOSIS — M48061 Spinal stenosis, lumbar region without neurogenic claudication: Secondary | ICD-10-CM | POA: Diagnosis not present

## 2023-12-09 DIAGNOSIS — R262 Difficulty in walking, not elsewhere classified: Secondary | ICD-10-CM | POA: Diagnosis not present

## 2023-12-24 DIAGNOSIS — R413 Other amnesia: Secondary | ICD-10-CM | POA: Diagnosis not present

## 2024-01-09 DIAGNOSIS — R262 Difficulty in walking, not elsewhere classified: Secondary | ICD-10-CM | POA: Diagnosis not present

## 2024-01-15 DIAGNOSIS — D509 Iron deficiency anemia, unspecified: Secondary | ICD-10-CM | POA: Diagnosis not present

## 2024-01-15 DIAGNOSIS — R600 Localized edema: Secondary | ICD-10-CM | POA: Diagnosis not present

## 2024-01-15 DIAGNOSIS — E039 Hypothyroidism, unspecified: Secondary | ICD-10-CM | POA: Diagnosis not present

## 2024-01-15 DIAGNOSIS — G309 Alzheimer's disease, unspecified: Secondary | ICD-10-CM | POA: Diagnosis not present

## 2024-01-15 DIAGNOSIS — E785 Hyperlipidemia, unspecified: Secondary | ICD-10-CM | POA: Diagnosis not present

## 2024-01-15 DIAGNOSIS — I10 Essential (primary) hypertension: Secondary | ICD-10-CM | POA: Diagnosis not present

## 2024-01-23 DIAGNOSIS — I959 Hypotension, unspecified: Secondary | ICD-10-CM | POA: Diagnosis not present

## 2024-02-08 DIAGNOSIS — R262 Difficulty in walking, not elsewhere classified: Secondary | ICD-10-CM | POA: Diagnosis not present

## 2024-02-17 DIAGNOSIS — S0003XA Contusion of scalp, initial encounter: Secondary | ICD-10-CM | POA: Diagnosis not present

## 2024-02-17 DIAGNOSIS — W19XXXA Unspecified fall, initial encounter: Secondary | ICD-10-CM | POA: Diagnosis not present

## 2024-02-17 DIAGNOSIS — M79645 Pain in left finger(s): Secondary | ICD-10-CM | POA: Diagnosis not present

## 2024-02-17 DIAGNOSIS — Z79899 Other long term (current) drug therapy: Secondary | ICD-10-CM | POA: Diagnosis not present

## 2024-02-17 DIAGNOSIS — G501 Atypical facial pain: Secondary | ICD-10-CM | POA: Diagnosis not present

## 2024-02-17 DIAGNOSIS — S0081XA Abrasion of other part of head, initial encounter: Secondary | ICD-10-CM | POA: Diagnosis not present

## 2024-02-17 DIAGNOSIS — Z7902 Long term (current) use of antithrombotics/antiplatelets: Secondary | ICD-10-CM | POA: Diagnosis not present

## 2024-02-17 DIAGNOSIS — S60021A Contusion of right index finger without damage to nail, initial encounter: Secondary | ICD-10-CM | POA: Diagnosis not present

## 2024-03-10 DIAGNOSIS — R262 Difficulty in walking, not elsewhere classified: Secondary | ICD-10-CM | POA: Diagnosis not present

## 2024-03-12 DIAGNOSIS — R350 Frequency of micturition: Secondary | ICD-10-CM | POA: Diagnosis not present

## 2024-03-12 DIAGNOSIS — I959 Hypotension, unspecified: Secondary | ICD-10-CM | POA: Diagnosis not present

## 2024-03-12 DIAGNOSIS — G309 Alzheimer's disease, unspecified: Secondary | ICD-10-CM | POA: Diagnosis not present

## 2024-03-23 DIAGNOSIS — R58 Hemorrhage, not elsewhere classified: Secondary | ICD-10-CM | POA: Diagnosis not present

## 2024-03-23 DIAGNOSIS — E876 Hypokalemia: Secondary | ICD-10-CM | POA: Diagnosis not present

## 2024-03-23 DIAGNOSIS — S0993XA Unspecified injury of face, initial encounter: Secondary | ICD-10-CM | POA: Diagnosis not present

## 2024-03-23 DIAGNOSIS — D72829 Elevated white blood cell count, unspecified: Secondary | ICD-10-CM | POA: Diagnosis not present

## 2024-03-23 DIAGNOSIS — Z9682 Presence of neurostimulator: Secondary | ICD-10-CM | POA: Diagnosis not present

## 2024-03-23 DIAGNOSIS — I10 Essential (primary) hypertension: Secondary | ICD-10-CM | POA: Diagnosis not present

## 2024-03-23 DIAGNOSIS — S0512XA Contusion of eyeball and orbital tissues, left eye, initial encounter: Secondary | ICD-10-CM | POA: Diagnosis not present

## 2024-03-23 DIAGNOSIS — M545 Low back pain, unspecified: Secondary | ICD-10-CM | POA: Diagnosis not present

## 2024-03-23 DIAGNOSIS — S40012A Contusion of left shoulder, initial encounter: Secondary | ICD-10-CM | POA: Diagnosis not present

## 2024-03-23 DIAGNOSIS — M2550 Pain in unspecified joint: Secondary | ICD-10-CM | POA: Diagnosis not present

## 2024-03-23 DIAGNOSIS — N19 Unspecified kidney failure: Secondary | ICD-10-CM | POA: Diagnosis not present

## 2024-03-23 DIAGNOSIS — Z888 Allergy status to other drugs, medicaments and biological substances status: Secondary | ICD-10-CM | POA: Diagnosis not present

## 2024-03-23 DIAGNOSIS — S80212A Abrasion, left knee, initial encounter: Secondary | ICD-10-CM | POA: Diagnosis not present

## 2024-03-23 DIAGNOSIS — S01112A Laceration without foreign body of left eyelid and periocular area, initial encounter: Secondary | ICD-10-CM | POA: Diagnosis not present

## 2024-03-23 DIAGNOSIS — S7002XD Contusion of left hip, subsequent encounter: Secondary | ICD-10-CM | POA: Diagnosis not present

## 2024-03-23 DIAGNOSIS — R296 Repeated falls: Secondary | ICD-10-CM | POA: Diagnosis not present

## 2024-03-23 DIAGNOSIS — S7002XA Contusion of left hip, initial encounter: Secondary | ICD-10-CM | POA: Diagnosis not present

## 2024-03-23 DIAGNOSIS — M25512 Pain in left shoulder: Secondary | ICD-10-CM | POA: Diagnosis not present

## 2024-03-23 DIAGNOSIS — S8002XA Contusion of left knee, initial encounter: Secondary | ICD-10-CM | POA: Diagnosis not present

## 2024-03-23 DIAGNOSIS — W19XXXA Unspecified fall, initial encounter: Secondary | ICD-10-CM | POA: Diagnosis not present

## 2024-03-23 DIAGNOSIS — I739 Peripheral vascular disease, unspecified: Secondary | ICD-10-CM | POA: Diagnosis not present

## 2024-03-23 DIAGNOSIS — R2689 Other abnormalities of gait and mobility: Secondary | ICD-10-CM | POA: Diagnosis not present

## 2024-03-23 DIAGNOSIS — S8012XD Contusion of left lower leg, subsequent encounter: Secondary | ICD-10-CM | POA: Diagnosis not present

## 2024-03-23 DIAGNOSIS — Z7401 Bed confinement status: Secondary | ICD-10-CM | POA: Diagnosis not present

## 2024-03-23 DIAGNOSIS — N179 Acute kidney failure, unspecified: Secondary | ICD-10-CM | POA: Diagnosis not present

## 2024-03-23 DIAGNOSIS — M1990 Unspecified osteoarthritis, unspecified site: Secondary | ICD-10-CM | POA: Diagnosis not present

## 2024-03-23 DIAGNOSIS — M6281 Muscle weakness (generalized): Secondary | ICD-10-CM | POA: Diagnosis not present

## 2024-03-23 DIAGNOSIS — S01112D Laceration without foreign body of left eyelid and periocular area, subsequent encounter: Secondary | ICD-10-CM | POA: Diagnosis not present

## 2024-03-23 DIAGNOSIS — S8012XA Contusion of left lower leg, initial encounter: Secondary | ICD-10-CM | POA: Diagnosis not present

## 2024-03-23 DIAGNOSIS — E860 Dehydration: Secondary | ICD-10-CM | POA: Diagnosis not present

## 2024-03-23 DIAGNOSIS — D509 Iron deficiency anemia, unspecified: Secondary | ICD-10-CM | POA: Diagnosis not present

## 2024-03-23 DIAGNOSIS — S299XXA Unspecified injury of thorax, initial encounter: Secondary | ICD-10-CM | POA: Diagnosis not present

## 2024-03-23 DIAGNOSIS — Z886 Allergy status to analgesic agent status: Secondary | ICD-10-CM | POA: Diagnosis not present

## 2024-03-23 DIAGNOSIS — R531 Weakness: Secondary | ICD-10-CM | POA: Diagnosis not present

## 2024-03-23 DIAGNOSIS — E039 Hypothyroidism, unspecified: Secondary | ICD-10-CM | POA: Diagnosis not present

## 2024-03-23 DIAGNOSIS — Z743 Need for continuous supervision: Secondary | ICD-10-CM | POA: Diagnosis not present

## 2024-03-23 DIAGNOSIS — R220 Localized swelling, mass and lump, head: Secondary | ICD-10-CM | POA: Diagnosis not present

## 2024-03-23 DIAGNOSIS — K219 Gastro-esophageal reflux disease without esophagitis: Secondary | ICD-10-CM | POA: Diagnosis not present

## 2024-03-23 DIAGNOSIS — Z043 Encounter for examination and observation following other accident: Secondary | ICD-10-CM | POA: Diagnosis not present

## 2024-03-23 DIAGNOSIS — Z8673 Personal history of transient ischemic attack (TIA), and cerebral infarction without residual deficits: Secondary | ICD-10-CM | POA: Diagnosis not present

## 2024-03-23 DIAGNOSIS — E785 Hyperlipidemia, unspecified: Secondary | ICD-10-CM | POA: Diagnosis not present

## 2024-03-23 DIAGNOSIS — Z9689 Presence of other specified functional implants: Secondary | ICD-10-CM | POA: Diagnosis not present

## 2024-03-23 DIAGNOSIS — S0512XD Contusion of eyeball and orbital tissues, left eye, subsequent encounter: Secondary | ICD-10-CM | POA: Diagnosis not present

## 2024-03-23 DIAGNOSIS — M810 Age-related osteoporosis without current pathological fracture: Secondary | ICD-10-CM | POA: Diagnosis not present

## 2024-03-23 DIAGNOSIS — S80212D Abrasion, left knee, subsequent encounter: Secondary | ICD-10-CM | POA: Diagnosis not present

## 2024-03-23 DIAGNOSIS — T796XXA Traumatic ischemia of muscle, initial encounter: Secondary | ICD-10-CM | POA: Diagnosis not present

## 2024-03-23 DIAGNOSIS — M6282 Rhabdomyolysis: Secondary | ICD-10-CM | POA: Diagnosis not present

## 2024-03-27 DIAGNOSIS — E039 Hypothyroidism, unspecified: Secondary | ICD-10-CM | POA: Diagnosis not present

## 2024-03-27 DIAGNOSIS — Z8673 Personal history of transient ischemic attack (TIA), and cerebral infarction without residual deficits: Secondary | ICD-10-CM | POA: Diagnosis not present

## 2024-03-27 DIAGNOSIS — Z9682 Presence of neurostimulator: Secondary | ICD-10-CM | POA: Diagnosis not present

## 2024-03-27 DIAGNOSIS — N179 Acute kidney failure, unspecified: Secondary | ICD-10-CM | POA: Diagnosis not present

## 2024-03-27 DIAGNOSIS — R5383 Other fatigue: Secondary | ICD-10-CM | POA: Diagnosis not present

## 2024-03-27 DIAGNOSIS — M2550 Pain in unspecified joint: Secondary | ICD-10-CM | POA: Diagnosis not present

## 2024-03-27 DIAGNOSIS — R9431 Abnormal electrocardiogram [ECG] [EKG]: Secondary | ICD-10-CM | POA: Diagnosis not present

## 2024-03-27 DIAGNOSIS — S7002XA Contusion of left hip, initial encounter: Secondary | ICD-10-CM | POA: Diagnosis not present

## 2024-03-27 DIAGNOSIS — S0512XD Contusion of eyeball and orbital tissues, left eye, subsequent encounter: Secondary | ICD-10-CM | POA: Diagnosis not present

## 2024-03-27 DIAGNOSIS — D649 Anemia, unspecified: Secondary | ICD-10-CM | POA: Diagnosis not present

## 2024-03-27 DIAGNOSIS — R296 Repeated falls: Secondary | ICD-10-CM | POA: Diagnosis not present

## 2024-03-27 DIAGNOSIS — Z79899 Other long term (current) drug therapy: Secondary | ICD-10-CM | POA: Diagnosis not present

## 2024-03-27 DIAGNOSIS — E860 Dehydration: Secondary | ICD-10-CM | POA: Diagnosis not present

## 2024-03-27 DIAGNOSIS — N19 Unspecified kidney failure: Secondary | ICD-10-CM | POA: Diagnosis not present

## 2024-03-27 DIAGNOSIS — I10 Essential (primary) hypertension: Secondary | ICD-10-CM | POA: Diagnosis not present

## 2024-03-27 DIAGNOSIS — M810 Age-related osteoporosis without current pathological fracture: Secondary | ICD-10-CM | POA: Diagnosis not present

## 2024-03-27 DIAGNOSIS — E876 Hypokalemia: Secondary | ICD-10-CM | POA: Diagnosis not present

## 2024-03-27 DIAGNOSIS — M6282 Rhabdomyolysis: Secondary | ICD-10-CM | POA: Diagnosis not present

## 2024-03-27 DIAGNOSIS — K219 Gastro-esophageal reflux disease without esophagitis: Secondary | ICD-10-CM | POA: Diagnosis not present

## 2024-03-27 DIAGNOSIS — Z886 Allergy status to analgesic agent status: Secondary | ICD-10-CM | POA: Diagnosis not present

## 2024-03-27 DIAGNOSIS — S80212D Abrasion, left knee, subsequent encounter: Secondary | ICD-10-CM | POA: Diagnosis not present

## 2024-03-27 DIAGNOSIS — R262 Difficulty in walking, not elsewhere classified: Secondary | ICD-10-CM | POA: Diagnosis not present

## 2024-03-27 DIAGNOSIS — M545 Low back pain, unspecified: Secondary | ICD-10-CM | POA: Diagnosis not present

## 2024-03-27 DIAGNOSIS — S7002XD Contusion of left hip, subsequent encounter: Secondary | ICD-10-CM | POA: Diagnosis not present

## 2024-03-27 DIAGNOSIS — S01112A Laceration without foreign body of left eyelid and periocular area, initial encounter: Secondary | ICD-10-CM | POA: Diagnosis not present

## 2024-03-27 DIAGNOSIS — S40012A Contusion of left shoulder, initial encounter: Secondary | ICD-10-CM | POA: Diagnosis not present

## 2024-03-27 DIAGNOSIS — S8012XA Contusion of left lower leg, initial encounter: Secondary | ICD-10-CM | POA: Diagnosis not present

## 2024-03-27 DIAGNOSIS — S01112D Laceration without foreign body of left eyelid and periocular area, subsequent encounter: Secondary | ICD-10-CM | POA: Diagnosis not present

## 2024-03-27 DIAGNOSIS — M6281 Muscle weakness (generalized): Secondary | ICD-10-CM | POA: Diagnosis not present

## 2024-03-27 DIAGNOSIS — R410 Disorientation, unspecified: Secondary | ICD-10-CM | POA: Diagnosis not present

## 2024-03-27 DIAGNOSIS — M1990 Unspecified osteoarthritis, unspecified site: Secondary | ICD-10-CM | POA: Diagnosis not present

## 2024-03-27 DIAGNOSIS — Z9689 Presence of other specified functional implants: Secondary | ICD-10-CM | POA: Diagnosis not present

## 2024-03-27 DIAGNOSIS — M25512 Pain in left shoulder: Secondary | ICD-10-CM | POA: Diagnosis not present

## 2024-03-27 DIAGNOSIS — S8002XA Contusion of left knee, initial encounter: Secondary | ICD-10-CM | POA: Diagnosis not present

## 2024-03-27 DIAGNOSIS — I739 Peripheral vascular disease, unspecified: Secondary | ICD-10-CM | POA: Diagnosis not present

## 2024-03-27 DIAGNOSIS — D509 Iron deficiency anemia, unspecified: Secondary | ICD-10-CM | POA: Diagnosis not present

## 2024-03-27 DIAGNOSIS — E785 Hyperlipidemia, unspecified: Secondary | ICD-10-CM | POA: Diagnosis not present

## 2024-03-27 DIAGNOSIS — T796XXA Traumatic ischemia of muscle, initial encounter: Secondary | ICD-10-CM | POA: Diagnosis not present

## 2024-03-27 DIAGNOSIS — S8012XD Contusion of left lower leg, subsequent encounter: Secondary | ICD-10-CM | POA: Diagnosis not present

## 2024-03-27 DIAGNOSIS — Z743 Need for continuous supervision: Secondary | ICD-10-CM | POA: Diagnosis not present

## 2024-03-27 DIAGNOSIS — Z7902 Long term (current) use of antithrombotics/antiplatelets: Secondary | ICD-10-CM | POA: Diagnosis not present

## 2024-03-27 DIAGNOSIS — I119 Hypertensive heart disease without heart failure: Secondary | ICD-10-CM | POA: Diagnosis not present

## 2024-03-27 DIAGNOSIS — R531 Weakness: Secondary | ICD-10-CM | POA: Diagnosis not present

## 2024-03-27 DIAGNOSIS — R2689 Other abnormalities of gait and mobility: Secondary | ICD-10-CM | POA: Diagnosis not present

## 2024-03-27 DIAGNOSIS — Z7401 Bed confinement status: Secondary | ICD-10-CM | POA: Diagnosis not present

## 2024-03-27 DIAGNOSIS — Z888 Allergy status to other drugs, medicaments and biological substances status: Secondary | ICD-10-CM | POA: Diagnosis not present

## 2024-03-27 DIAGNOSIS — D72829 Elevated white blood cell count, unspecified: Secondary | ICD-10-CM | POA: Diagnosis not present

## 2024-03-28 DIAGNOSIS — I119 Hypertensive heart disease without heart failure: Secondary | ICD-10-CM | POA: Diagnosis not present

## 2024-03-28 DIAGNOSIS — N179 Acute kidney failure, unspecified: Secondary | ICD-10-CM | POA: Diagnosis not present

## 2024-03-28 DIAGNOSIS — D649 Anemia, unspecified: Secondary | ICD-10-CM | POA: Diagnosis not present

## 2024-03-28 DIAGNOSIS — R262 Difficulty in walking, not elsewhere classified: Secondary | ICD-10-CM | POA: Diagnosis not present

## 2024-04-07 DIAGNOSIS — Z8673 Personal history of transient ischemic attack (TIA), and cerebral infarction without residual deficits: Secondary | ICD-10-CM | POA: Diagnosis not present

## 2024-04-07 DIAGNOSIS — Z79899 Other long term (current) drug therapy: Secondary | ICD-10-CM | POA: Diagnosis not present

## 2024-04-07 DIAGNOSIS — Z7902 Long term (current) use of antithrombotics/antiplatelets: Secondary | ICD-10-CM | POA: Diagnosis not present

## 2024-04-07 DIAGNOSIS — R410 Disorientation, unspecified: Secondary | ICD-10-CM | POA: Diagnosis not present

## 2024-04-07 DIAGNOSIS — R9431 Abnormal electrocardiogram [ECG] [EKG]: Secondary | ICD-10-CM | POA: Diagnosis not present

## 2024-04-07 DIAGNOSIS — R5383 Other fatigue: Secondary | ICD-10-CM | POA: Diagnosis not present

## 2024-04-10 DIAGNOSIS — R262 Difficulty in walking, not elsewhere classified: Secondary | ICD-10-CM | POA: Diagnosis not present

## 2024-04-14 DIAGNOSIS — M1990 Unspecified osteoarthritis, unspecified site: Secondary | ICD-10-CM | POA: Diagnosis not present

## 2024-04-14 DIAGNOSIS — E876 Hypokalemia: Secondary | ICD-10-CM | POA: Diagnosis not present

## 2024-04-14 DIAGNOSIS — M6282 Rhabdomyolysis: Secondary | ICD-10-CM | POA: Diagnosis not present

## 2024-04-14 DIAGNOSIS — K219 Gastro-esophageal reflux disease without esophagitis: Secondary | ICD-10-CM | POA: Diagnosis not present

## 2024-04-14 DIAGNOSIS — G8929 Other chronic pain: Secondary | ICD-10-CM | POA: Diagnosis not present

## 2024-04-14 DIAGNOSIS — I739 Peripheral vascular disease, unspecified: Secondary | ICD-10-CM | POA: Diagnosis not present

## 2024-04-14 DIAGNOSIS — M545 Low back pain, unspecified: Secondary | ICD-10-CM | POA: Diagnosis not present

## 2024-04-14 DIAGNOSIS — D509 Iron deficiency anemia, unspecified: Secondary | ICD-10-CM | POA: Diagnosis not present

## 2024-04-14 DIAGNOSIS — G309 Alzheimer's disease, unspecified: Secondary | ICD-10-CM | POA: Diagnosis not present

## 2024-04-14 DIAGNOSIS — M48061 Spinal stenosis, lumbar region without neurogenic claudication: Secondary | ICD-10-CM | POA: Diagnosis not present

## 2024-04-14 DIAGNOSIS — K5730 Diverticulosis of large intestine without perforation or abscess without bleeding: Secondary | ICD-10-CM | POA: Diagnosis not present

## 2024-04-14 DIAGNOSIS — M47816 Spondylosis without myelopathy or radiculopathy, lumbar region: Secondary | ICD-10-CM | POA: Diagnosis not present

## 2024-04-14 DIAGNOSIS — N189 Chronic kidney disease, unspecified: Secondary | ICD-10-CM | POA: Diagnosis not present

## 2024-04-14 DIAGNOSIS — E039 Hypothyroidism, unspecified: Secondary | ICD-10-CM | POA: Diagnosis not present

## 2024-04-14 DIAGNOSIS — I129 Hypertensive chronic kidney disease with stage 1 through stage 4 chronic kidney disease, or unspecified chronic kidney disease: Secondary | ICD-10-CM | POA: Diagnosis not present

## 2024-04-14 DIAGNOSIS — E785 Hyperlipidemia, unspecified: Secondary | ICD-10-CM | POA: Diagnosis not present

## 2024-04-14 DIAGNOSIS — M51369 Other intervertebral disc degeneration, lumbar region without mention of lumbar back pain or lower extremity pain: Secondary | ICD-10-CM | POA: Diagnosis not present

## 2024-04-15 DIAGNOSIS — N189 Chronic kidney disease, unspecified: Secondary | ICD-10-CM | POA: Diagnosis not present

## 2024-04-15 DIAGNOSIS — M6282 Rhabdomyolysis: Secondary | ICD-10-CM | POA: Diagnosis not present

## 2024-04-15 DIAGNOSIS — E039 Hypothyroidism, unspecified: Secondary | ICD-10-CM | POA: Diagnosis not present

## 2024-04-15 DIAGNOSIS — M1990 Unspecified osteoarthritis, unspecified site: Secondary | ICD-10-CM | POA: Diagnosis not present

## 2024-04-15 DIAGNOSIS — K5730 Diverticulosis of large intestine without perforation or abscess without bleeding: Secondary | ICD-10-CM | POA: Diagnosis not present

## 2024-04-15 DIAGNOSIS — G309 Alzheimer's disease, unspecified: Secondary | ICD-10-CM | POA: Diagnosis not present

## 2024-04-15 DIAGNOSIS — I129 Hypertensive chronic kidney disease with stage 1 through stage 4 chronic kidney disease, or unspecified chronic kidney disease: Secondary | ICD-10-CM | POA: Diagnosis not present

## 2024-04-15 DIAGNOSIS — M545 Low back pain, unspecified: Secondary | ICD-10-CM | POA: Diagnosis not present

## 2024-04-15 DIAGNOSIS — D509 Iron deficiency anemia, unspecified: Secondary | ICD-10-CM | POA: Diagnosis not present

## 2024-04-15 DIAGNOSIS — K219 Gastro-esophageal reflux disease without esophagitis: Secondary | ICD-10-CM | POA: Diagnosis not present

## 2024-04-15 DIAGNOSIS — G8929 Other chronic pain: Secondary | ICD-10-CM | POA: Diagnosis not present

## 2024-04-15 DIAGNOSIS — E785 Hyperlipidemia, unspecified: Secondary | ICD-10-CM | POA: Diagnosis not present

## 2024-04-15 DIAGNOSIS — E876 Hypokalemia: Secondary | ICD-10-CM | POA: Diagnosis not present

## 2024-04-15 DIAGNOSIS — M47816 Spondylosis without myelopathy or radiculopathy, lumbar region: Secondary | ICD-10-CM | POA: Diagnosis not present

## 2024-04-15 DIAGNOSIS — M48061 Spinal stenosis, lumbar region without neurogenic claudication: Secondary | ICD-10-CM | POA: Diagnosis not present

## 2024-04-15 DIAGNOSIS — I739 Peripheral vascular disease, unspecified: Secondary | ICD-10-CM | POA: Diagnosis not present

## 2024-04-15 DIAGNOSIS — M51369 Other intervertebral disc degeneration, lumbar region without mention of lumbar back pain or lower extremity pain: Secondary | ICD-10-CM | POA: Diagnosis not present

## 2024-04-16 DIAGNOSIS — E039 Hypothyroidism, unspecified: Secondary | ICD-10-CM | POA: Diagnosis not present

## 2024-04-16 DIAGNOSIS — T796XXA Traumatic ischemia of muscle, initial encounter: Secondary | ICD-10-CM | POA: Diagnosis not present

## 2024-04-16 DIAGNOSIS — D509 Iron deficiency anemia, unspecified: Secondary | ICD-10-CM | POA: Diagnosis not present

## 2024-04-16 DIAGNOSIS — S01112S Laceration without foreign body of left eyelid and periocular area, sequela: Secondary | ICD-10-CM | POA: Diagnosis not present

## 2024-04-16 DIAGNOSIS — R296 Repeated falls: Secondary | ICD-10-CM | POA: Diagnosis not present

## 2024-04-16 DIAGNOSIS — I10 Essential (primary) hypertension: Secondary | ICD-10-CM | POA: Diagnosis not present

## 2024-04-16 DIAGNOSIS — G309 Alzheimer's disease, unspecified: Secondary | ICD-10-CM | POA: Diagnosis not present

## 2024-04-16 DIAGNOSIS — S40012D Contusion of left shoulder, subsequent encounter: Secondary | ICD-10-CM | POA: Diagnosis not present

## 2024-04-16 DIAGNOSIS — N179 Acute kidney failure, unspecified: Secondary | ICD-10-CM | POA: Diagnosis not present

## 2024-04-16 DIAGNOSIS — S7002XD Contusion of left hip, subsequent encounter: Secondary | ICD-10-CM | POA: Diagnosis not present

## 2024-04-16 DIAGNOSIS — E785 Hyperlipidemia, unspecified: Secondary | ICD-10-CM | POA: Diagnosis not present

## 2024-04-16 DIAGNOSIS — S8012XD Contusion of left lower leg, subsequent encounter: Secondary | ICD-10-CM | POA: Diagnosis not present

## 2024-04-16 DIAGNOSIS — E876 Hypokalemia: Secondary | ICD-10-CM | POA: Diagnosis not present

## 2024-05-04 DIAGNOSIS — Z79899 Other long term (current) drug therapy: Secondary | ICD-10-CM | POA: Diagnosis not present

## 2024-05-04 DIAGNOSIS — R079 Chest pain, unspecified: Secondary | ICD-10-CM | POA: Diagnosis not present

## 2024-05-04 DIAGNOSIS — M79606 Pain in leg, unspecified: Secondary | ICD-10-CM | POA: Diagnosis not present

## 2024-05-04 DIAGNOSIS — Z043 Encounter for examination and observation following other accident: Secondary | ICD-10-CM | POA: Diagnosis not present

## 2024-05-04 DIAGNOSIS — Z743 Need for continuous supervision: Secondary | ICD-10-CM | POA: Diagnosis not present

## 2024-05-04 DIAGNOSIS — Z7902 Long term (current) use of antithrombotics/antiplatelets: Secondary | ICD-10-CM | POA: Diagnosis not present

## 2024-05-04 DIAGNOSIS — Z882 Allergy status to sulfonamides status: Secondary | ICD-10-CM | POA: Diagnosis not present

## 2024-05-04 DIAGNOSIS — M160 Bilateral primary osteoarthritis of hip: Secondary | ICD-10-CM | POA: Diagnosis not present

## 2024-05-04 DIAGNOSIS — W19XXXA Unspecified fall, initial encounter: Secondary | ICD-10-CM | POA: Diagnosis not present

## 2024-05-10 DIAGNOSIS — R262 Difficulty in walking, not elsewhere classified: Secondary | ICD-10-CM | POA: Diagnosis not present

## 2024-08-28 ENCOUNTER — Ambulatory Visit: Admitting: Neurology
